# Patient Record
Sex: Female | Born: 1945
Health system: Southern US, Community
[De-identification: ages and names within clinical notes are randomized; demographics above are authoritative.]

## PROBLEM LIST (undated history)

## (undated) DIAGNOSIS — E7800 Pure hypercholesterolemia, unspecified: Secondary | ICD-10-CM

## (undated) DIAGNOSIS — C801 Malignant (primary) neoplasm, unspecified: Secondary | ICD-10-CM

## (undated) DIAGNOSIS — Z8041 Family history of malignant neoplasm of ovary: Secondary | ICD-10-CM

## (undated) DIAGNOSIS — I10 Essential (primary) hypertension: Secondary | ICD-10-CM

## (undated) DIAGNOSIS — K8020 Calculus of gallbladder without cholecystitis without obstruction: Secondary | ICD-10-CM

## (undated) HISTORY — PX: HERNIA REPAIR: SHX51

## (undated) HISTORY — DX: Family history of malignant neoplasm of ovary: Z80.41

---

## 2001-10-23 ENCOUNTER — Other Ambulatory Visit: Admission: RE | Admit: 2001-10-23 | Discharge: 2001-10-23 | Payer: Self-pay | Admitting: Family Medicine

## 2003-08-10 ENCOUNTER — Other Ambulatory Visit: Admission: RE | Admit: 2003-08-10 | Discharge: 2003-08-10 | Payer: Self-pay | Admitting: Family Medicine

## 2004-09-13 ENCOUNTER — Other Ambulatory Visit: Admission: RE | Admit: 2004-09-13 | Discharge: 2004-09-13 | Payer: Self-pay | Admitting: Family Medicine

## 2005-10-16 ENCOUNTER — Other Ambulatory Visit: Admission: RE | Admit: 2005-10-16 | Discharge: 2005-10-16 | Payer: Self-pay | Admitting: Family Medicine

## 2010-11-12 ENCOUNTER — Other Ambulatory Visit: Payer: Self-pay | Admitting: Family Medicine

## 2010-11-12 ENCOUNTER — Other Ambulatory Visit (HOSPITAL_COMMUNITY)
Admission: RE | Admit: 2010-11-12 | Discharge: 2010-11-12 | Disposition: A | Discharge status: Home / Self Care | Payer: PRIVATE HEALTH INSURANCE | Source: Ambulatory Visit | Attending: Family Medicine | Admitting: Family Medicine

## 2010-11-12 DIAGNOSIS — Z124 Encounter for screening for malignant neoplasm of cervix: Secondary | ICD-10-CM | POA: Insufficient documentation

## 2012-09-01 ENCOUNTER — Other Ambulatory Visit: Payer: Self-pay

## 2013-06-03 DIAGNOSIS — E785 Hyperlipidemia, unspecified: Secondary | ICD-10-CM | POA: Diagnosis not present

## 2013-06-03 DIAGNOSIS — I10 Essential (primary) hypertension: Secondary | ICD-10-CM | POA: Diagnosis not present

## 2013-06-03 DIAGNOSIS — L255 Unspecified contact dermatitis due to plants, except food: Secondary | ICD-10-CM | POA: Diagnosis not present

## 2013-06-03 DIAGNOSIS — R7301 Impaired fasting glucose: Secondary | ICD-10-CM | POA: Diagnosis not present

## 2013-06-03 DIAGNOSIS — Z23 Encounter for immunization: Secondary | ICD-10-CM | POA: Diagnosis not present

## 2013-10-07 DIAGNOSIS — Z1231 Encounter for screening mammogram for malignant neoplasm of breast: Secondary | ICD-10-CM | POA: Diagnosis not present

## 2013-11-24 ENCOUNTER — Other Ambulatory Visit: Payer: Self-pay

## 2013-11-24 DIAGNOSIS — L821 Other seborrheic keratosis: Secondary | ICD-10-CM | POA: Diagnosis not present

## 2013-11-24 DIAGNOSIS — C4441 Basal cell carcinoma of skin of scalp and neck: Secondary | ICD-10-CM | POA: Diagnosis not present

## 2013-11-24 DIAGNOSIS — D239 Other benign neoplasm of skin, unspecified: Secondary | ICD-10-CM | POA: Diagnosis not present

## 2013-11-24 DIAGNOSIS — D1801 Hemangioma of skin and subcutaneous tissue: Secondary | ICD-10-CM | POA: Diagnosis not present

## 2013-11-24 DIAGNOSIS — C44319 Basal cell carcinoma of skin of other parts of face: Secondary | ICD-10-CM | POA: Diagnosis not present

## 2013-11-24 DIAGNOSIS — Z85828 Personal history of other malignant neoplasm of skin: Secondary | ICD-10-CM | POA: Diagnosis not present

## 2013-12-01 DIAGNOSIS — E785 Hyperlipidemia, unspecified: Secondary | ICD-10-CM | POA: Diagnosis not present

## 2013-12-01 DIAGNOSIS — E559 Vitamin D deficiency, unspecified: Secondary | ICD-10-CM | POA: Diagnosis not present

## 2013-12-01 DIAGNOSIS — I10 Essential (primary) hypertension: Secondary | ICD-10-CM | POA: Diagnosis not present

## 2013-12-02 ENCOUNTER — Other Ambulatory Visit (HOSPITAL_COMMUNITY)
Admission: RE | Admit: 2013-12-02 | Discharge: 2013-12-02 | Disposition: A | Discharge status: Home / Self Care | Payer: Medicare Other | Source: Ambulatory Visit | Attending: Family Medicine | Admitting: Family Medicine

## 2013-12-02 ENCOUNTER — Other Ambulatory Visit: Payer: Self-pay | Admitting: Family Medicine

## 2013-12-02 DIAGNOSIS — Z124 Encounter for screening for malignant neoplasm of cervix: Secondary | ICD-10-CM | POA: Diagnosis not present

## 2013-12-02 DIAGNOSIS — E785 Hyperlipidemia, unspecified: Secondary | ICD-10-CM | POA: Diagnosis not present

## 2013-12-02 DIAGNOSIS — Z0000 Encounter for general adult medical examination without abnormal findings: Secondary | ICD-10-CM | POA: Diagnosis not present

## 2013-12-02 DIAGNOSIS — Z23 Encounter for immunization: Secondary | ICD-10-CM | POA: Diagnosis not present

## 2013-12-02 DIAGNOSIS — G43909 Migraine, unspecified, not intractable, without status migrainosus: Secondary | ICD-10-CM | POA: Diagnosis not present

## 2013-12-02 DIAGNOSIS — I10 Essential (primary) hypertension: Secondary | ICD-10-CM | POA: Diagnosis not present

## 2013-12-02 DIAGNOSIS — Z1211 Encounter for screening for malignant neoplasm of colon: Secondary | ICD-10-CM | POA: Diagnosis not present

## 2013-12-02 DIAGNOSIS — E559 Vitamin D deficiency, unspecified: Secondary | ICD-10-CM | POA: Diagnosis not present

## 2014-02-01 DIAGNOSIS — Z85828 Personal history of other malignant neoplasm of skin: Secondary | ICD-10-CM | POA: Diagnosis not present

## 2014-02-01 DIAGNOSIS — C44319 Basal cell carcinoma of skin of other parts of face: Secondary | ICD-10-CM | POA: Diagnosis not present

## 2014-04-18 DIAGNOSIS — H2510 Age-related nuclear cataract, unspecified eye: Secondary | ICD-10-CM | POA: Diagnosis not present

## 2014-07-22 DIAGNOSIS — E785 Hyperlipidemia, unspecified: Secondary | ICD-10-CM | POA: Diagnosis not present

## 2014-07-22 DIAGNOSIS — E559 Vitamin D deficiency, unspecified: Secondary | ICD-10-CM | POA: Diagnosis not present

## 2014-07-22 DIAGNOSIS — Z1211 Encounter for screening for malignant neoplasm of colon: Secondary | ICD-10-CM | POA: Diagnosis not present

## 2014-07-22 DIAGNOSIS — Z23 Encounter for immunization: Secondary | ICD-10-CM | POA: Diagnosis not present

## 2014-07-22 DIAGNOSIS — I10 Essential (primary) hypertension: Secondary | ICD-10-CM | POA: Diagnosis not present

## 2014-10-11 ENCOUNTER — Other Ambulatory Visit: Payer: Self-pay

## 2014-10-11 DIAGNOSIS — L814 Other melanin hyperpigmentation: Secondary | ICD-10-CM | POA: Diagnosis not present

## 2014-10-11 DIAGNOSIS — D1801 Hemangioma of skin and subcutaneous tissue: Secondary | ICD-10-CM | POA: Diagnosis not present

## 2014-10-11 DIAGNOSIS — D485 Neoplasm of uncertain behavior of skin: Secondary | ICD-10-CM | POA: Diagnosis not present

## 2014-10-11 DIAGNOSIS — C44311 Basal cell carcinoma of skin of nose: Secondary | ICD-10-CM | POA: Diagnosis not present

## 2014-10-11 DIAGNOSIS — Z85828 Personal history of other malignant neoplasm of skin: Secondary | ICD-10-CM | POA: Diagnosis not present

## 2014-10-11 DIAGNOSIS — L820 Inflamed seborrheic keratosis: Secondary | ICD-10-CM | POA: Diagnosis not present

## 2014-10-11 DIAGNOSIS — D225 Melanocytic nevi of trunk: Secondary | ICD-10-CM | POA: Diagnosis not present

## 2014-10-27 DIAGNOSIS — Z85828 Personal history of other malignant neoplasm of skin: Secondary | ICD-10-CM | POA: Diagnosis not present

## 2014-10-27 DIAGNOSIS — C44311 Basal cell carcinoma of skin of nose: Secondary | ICD-10-CM | POA: Diagnosis not present

## 2015-01-16 DIAGNOSIS — Z1231 Encounter for screening mammogram for malignant neoplasm of breast: Secondary | ICD-10-CM | POA: Diagnosis not present

## 2015-01-25 DIAGNOSIS — E785 Hyperlipidemia, unspecified: Secondary | ICD-10-CM | POA: Diagnosis not present

## 2015-01-25 DIAGNOSIS — Z1211 Encounter for screening for malignant neoplasm of colon: Secondary | ICD-10-CM | POA: Diagnosis not present

## 2015-01-25 DIAGNOSIS — Z0000 Encounter for general adult medical examination without abnormal findings: Secondary | ICD-10-CM | POA: Diagnosis not present

## 2015-01-25 DIAGNOSIS — L237 Allergic contact dermatitis due to plants, except food: Secondary | ICD-10-CM | POA: Diagnosis not present

## 2015-01-25 DIAGNOSIS — E559 Vitamin D deficiency, unspecified: Secondary | ICD-10-CM | POA: Diagnosis not present

## 2015-01-25 DIAGNOSIS — N952 Postmenopausal atrophic vaginitis: Secondary | ICD-10-CM | POA: Diagnosis not present

## 2015-01-25 DIAGNOSIS — I10 Essential (primary) hypertension: Secondary | ICD-10-CM | POA: Diagnosis not present

## 2015-01-25 DIAGNOSIS — R1011 Right upper quadrant pain: Secondary | ICD-10-CM | POA: Diagnosis not present

## 2015-01-25 DIAGNOSIS — G43909 Migraine, unspecified, not intractable, without status migrainosus: Secondary | ICD-10-CM | POA: Diagnosis not present

## 2015-02-15 DIAGNOSIS — I10 Essential (primary) hypertension: Secondary | ICD-10-CM | POA: Diagnosis not present

## 2015-05-22 DIAGNOSIS — H2513 Age-related nuclear cataract, bilateral: Secondary | ICD-10-CM | POA: Diagnosis not present

## 2015-05-23 ENCOUNTER — Encounter: Payer: Self-pay | Admitting: Family Medicine

## 2015-06-21 DIAGNOSIS — Z23 Encounter for immunization: Secondary | ICD-10-CM | POA: Diagnosis not present

## 2015-07-27 DIAGNOSIS — Z1211 Encounter for screening for malignant neoplasm of colon: Secondary | ICD-10-CM | POA: Diagnosis not present

## 2015-07-27 DIAGNOSIS — E785 Hyperlipidemia, unspecified: Secondary | ICD-10-CM | POA: Diagnosis not present

## 2015-07-27 DIAGNOSIS — I10 Essential (primary) hypertension: Secondary | ICD-10-CM | POA: Diagnosis not present

## 2015-08-14 DIAGNOSIS — Z1211 Encounter for screening for malignant neoplasm of colon: Secondary | ICD-10-CM | POA: Diagnosis not present

## 2015-10-13 DIAGNOSIS — D225 Melanocytic nevi of trunk: Secondary | ICD-10-CM | POA: Diagnosis not present

## 2015-10-13 DIAGNOSIS — L918 Other hypertrophic disorders of the skin: Secondary | ICD-10-CM | POA: Diagnosis not present

## 2015-10-13 DIAGNOSIS — D485 Neoplasm of uncertain behavior of skin: Secondary | ICD-10-CM | POA: Diagnosis not present

## 2015-10-13 DIAGNOSIS — Z85828 Personal history of other malignant neoplasm of skin: Secondary | ICD-10-CM | POA: Diagnosis not present

## 2015-10-13 DIAGNOSIS — L821 Other seborrheic keratosis: Secondary | ICD-10-CM | POA: Diagnosis not present

## 2015-10-13 DIAGNOSIS — D1801 Hemangioma of skin and subcutaneous tissue: Secondary | ICD-10-CM | POA: Diagnosis not present

## 2015-10-13 DIAGNOSIS — L814 Other melanin hyperpigmentation: Secondary | ICD-10-CM | POA: Diagnosis not present

## 2015-10-13 DIAGNOSIS — L570 Actinic keratosis: Secondary | ICD-10-CM | POA: Diagnosis not present

## 2015-10-13 DIAGNOSIS — L218 Other seborrheic dermatitis: Secondary | ICD-10-CM | POA: Diagnosis not present

## 2015-11-03 DIAGNOSIS — R109 Unspecified abdominal pain: Secondary | ICD-10-CM | POA: Diagnosis not present

## 2015-12-15 DIAGNOSIS — Z1231 Encounter for screening mammogram for malignant neoplasm of breast: Secondary | ICD-10-CM | POA: Diagnosis not present

## 2016-02-01 DIAGNOSIS — I10 Essential (primary) hypertension: Secondary | ICD-10-CM | POA: Diagnosis not present

## 2016-02-01 DIAGNOSIS — Z0000 Encounter for general adult medical examination without abnormal findings: Secondary | ICD-10-CM | POA: Diagnosis not present

## 2016-02-01 DIAGNOSIS — Z1389 Encounter for screening for other disorder: Secondary | ICD-10-CM | POA: Diagnosis not present

## 2016-02-01 DIAGNOSIS — Z1211 Encounter for screening for malignant neoplasm of colon: Secondary | ICD-10-CM | POA: Diagnosis not present

## 2016-02-01 DIAGNOSIS — E559 Vitamin D deficiency, unspecified: Secondary | ICD-10-CM | POA: Diagnosis not present

## 2016-02-01 DIAGNOSIS — G43909 Migraine, unspecified, not intractable, without status migrainosus: Secondary | ICD-10-CM | POA: Diagnosis not present

## 2016-02-01 DIAGNOSIS — R7301 Impaired fasting glucose: Secondary | ICD-10-CM | POA: Diagnosis not present

## 2016-02-01 DIAGNOSIS — N952 Postmenopausal atrophic vaginitis: Secondary | ICD-10-CM | POA: Diagnosis not present

## 2016-02-01 DIAGNOSIS — L237 Allergic contact dermatitis due to plants, except food: Secondary | ICD-10-CM | POA: Diagnosis not present

## 2016-02-01 DIAGNOSIS — M533 Sacrococcygeal disorders, not elsewhere classified: Secondary | ICD-10-CM | POA: Diagnosis not present

## 2016-02-01 DIAGNOSIS — R109 Unspecified abdominal pain: Secondary | ICD-10-CM | POA: Diagnosis not present

## 2016-02-01 DIAGNOSIS — E785 Hyperlipidemia, unspecified: Secondary | ICD-10-CM | POA: Diagnosis not present

## 2016-02-05 ENCOUNTER — Ambulatory Visit
Admission: RE | Admit: 2016-02-05 | Discharge: 2016-02-05 | Disposition: A | Discharge status: Home / Self Care | Payer: Medicare Other | Source: Ambulatory Visit | Attending: Family Medicine | Admitting: Family Medicine

## 2016-02-05 ENCOUNTER — Other Ambulatory Visit: Payer: Self-pay | Admitting: Family Medicine

## 2016-02-05 DIAGNOSIS — M533 Sacrococcygeal disorders, not elsewhere classified: Secondary | ICD-10-CM

## 2016-03-04 ENCOUNTER — Encounter: Payer: Self-pay | Admitting: Family Medicine

## 2016-06-21 DIAGNOSIS — Z23 Encounter for immunization: Secondary | ICD-10-CM | POA: Diagnosis not present

## 2016-08-12 DIAGNOSIS — R7301 Impaired fasting glucose: Secondary | ICD-10-CM | POA: Diagnosis not present

## 2016-08-12 DIAGNOSIS — I10 Essential (primary) hypertension: Secondary | ICD-10-CM | POA: Diagnosis not present

## 2016-08-12 DIAGNOSIS — E6609 Other obesity due to excess calories: Secondary | ICD-10-CM | POA: Diagnosis not present

## 2016-08-12 DIAGNOSIS — E785 Hyperlipidemia, unspecified: Secondary | ICD-10-CM | POA: Diagnosis not present

## 2016-10-25 DIAGNOSIS — D485 Neoplasm of uncertain behavior of skin: Secondary | ICD-10-CM | POA: Diagnosis not present

## 2016-10-25 DIAGNOSIS — C44612 Basal cell carcinoma of skin of right upper limb, including shoulder: Secondary | ICD-10-CM | POA: Diagnosis not present

## 2016-10-25 DIAGNOSIS — L820 Inflamed seborrheic keratosis: Secondary | ICD-10-CM | POA: Diagnosis not present

## 2016-10-25 DIAGNOSIS — Z85828 Personal history of other malignant neoplasm of skin: Secondary | ICD-10-CM | POA: Diagnosis not present

## 2016-10-25 DIAGNOSIS — L821 Other seborrheic keratosis: Secondary | ICD-10-CM | POA: Diagnosis not present

## 2016-10-25 DIAGNOSIS — D2272 Melanocytic nevi of left lower limb, including hip: Secondary | ICD-10-CM | POA: Diagnosis not present

## 2016-10-25 DIAGNOSIS — L814 Other melanin hyperpigmentation: Secondary | ICD-10-CM | POA: Diagnosis not present

## 2016-10-25 DIAGNOSIS — D1801 Hemangioma of skin and subcutaneous tissue: Secondary | ICD-10-CM | POA: Diagnosis not present

## 2016-11-07 DIAGNOSIS — C44612 Basal cell carcinoma of skin of right upper limb, including shoulder: Secondary | ICD-10-CM | POA: Diagnosis not present

## 2016-11-07 DIAGNOSIS — Z85828 Personal history of other malignant neoplasm of skin: Secondary | ICD-10-CM | POA: Diagnosis not present

## 2016-12-02 DIAGNOSIS — M8589 Other specified disorders of bone density and structure, multiple sites: Secondary | ICD-10-CM | POA: Diagnosis not present

## 2016-12-02 DIAGNOSIS — Z1231 Encounter for screening mammogram for malignant neoplasm of breast: Secondary | ICD-10-CM | POA: Diagnosis not present

## 2017-02-27 DIAGNOSIS — I10 Essential (primary) hypertension: Secondary | ICD-10-CM | POA: Diagnosis not present

## 2017-02-27 DIAGNOSIS — Z1211 Encounter for screening for malignant neoplasm of colon: Secondary | ICD-10-CM | POA: Diagnosis not present

## 2017-02-27 DIAGNOSIS — R7303 Prediabetes: Secondary | ICD-10-CM | POA: Diagnosis not present

## 2017-02-27 DIAGNOSIS — E785 Hyperlipidemia, unspecified: Secondary | ICD-10-CM | POA: Diagnosis not present

## 2017-02-27 DIAGNOSIS — G43909 Migraine, unspecified, not intractable, without status migrainosus: Secondary | ICD-10-CM | POA: Diagnosis not present

## 2017-02-27 DIAGNOSIS — N898 Other specified noninflammatory disorders of vagina: Secondary | ICD-10-CM | POA: Diagnosis not present

## 2017-02-27 DIAGNOSIS — L237 Allergic contact dermatitis due to plants, except food: Secondary | ICD-10-CM | POA: Diagnosis not present

## 2017-02-27 DIAGNOSIS — Z0000 Encounter for general adult medical examination without abnormal findings: Secondary | ICD-10-CM | POA: Diagnosis not present

## 2017-02-27 DIAGNOSIS — E559 Vitamin D deficiency, unspecified: Secondary | ICD-10-CM | POA: Diagnosis not present

## 2017-03-14 DIAGNOSIS — Z1211 Encounter for screening for malignant neoplasm of colon: Secondary | ICD-10-CM | POA: Diagnosis not present

## 2017-06-06 DIAGNOSIS — Z23 Encounter for immunization: Secondary | ICD-10-CM | POA: Diagnosis not present

## 2017-09-01 DIAGNOSIS — I10 Essential (primary) hypertension: Secondary | ICD-10-CM | POA: Diagnosis not present

## 2017-09-01 DIAGNOSIS — E785 Hyperlipidemia, unspecified: Secondary | ICD-10-CM | POA: Diagnosis not present

## 2017-09-01 DIAGNOSIS — R7303 Prediabetes: Secondary | ICD-10-CM | POA: Diagnosis not present

## 2017-09-01 DIAGNOSIS — D179 Benign lipomatous neoplasm, unspecified: Secondary | ICD-10-CM | POA: Diagnosis not present

## 2017-10-31 DIAGNOSIS — C44519 Basal cell carcinoma of skin of other part of trunk: Secondary | ICD-10-CM | POA: Diagnosis not present

## 2017-10-31 DIAGNOSIS — Z85828 Personal history of other malignant neoplasm of skin: Secondary | ICD-10-CM | POA: Diagnosis not present

## 2017-10-31 DIAGNOSIS — D1801 Hemangioma of skin and subcutaneous tissue: Secondary | ICD-10-CM | POA: Diagnosis not present

## 2017-10-31 DIAGNOSIS — L570 Actinic keratosis: Secondary | ICD-10-CM | POA: Diagnosis not present

## 2017-10-31 DIAGNOSIS — D485 Neoplasm of uncertain behavior of skin: Secondary | ICD-10-CM | POA: Diagnosis not present

## 2017-10-31 DIAGNOSIS — L821 Other seborrheic keratosis: Secondary | ICD-10-CM | POA: Diagnosis not present

## 2017-11-14 DIAGNOSIS — H2513 Age-related nuclear cataract, bilateral: Secondary | ICD-10-CM | POA: Diagnosis not present

## 2017-11-21 DIAGNOSIS — Z85828 Personal history of other malignant neoplasm of skin: Secondary | ICD-10-CM | POA: Diagnosis not present

## 2017-11-21 DIAGNOSIS — C44519 Basal cell carcinoma of skin of other part of trunk: Secondary | ICD-10-CM | POA: Diagnosis not present

## 2017-12-18 DIAGNOSIS — M75101 Unspecified rotator cuff tear or rupture of right shoulder, not specified as traumatic: Secondary | ICD-10-CM | POA: Diagnosis not present

## 2018-01-09 DIAGNOSIS — Z1231 Encounter for screening mammogram for malignant neoplasm of breast: Secondary | ICD-10-CM | POA: Diagnosis not present

## 2018-01-20 DIAGNOSIS — N6312 Unspecified lump in the right breast, upper inner quadrant: Secondary | ICD-10-CM | POA: Diagnosis not present

## 2018-01-27 ENCOUNTER — Other Ambulatory Visit: Payer: Self-pay | Admitting: Radiology

## 2018-01-27 DIAGNOSIS — C50211 Malignant neoplasm of upper-inner quadrant of right female breast: Secondary | ICD-10-CM | POA: Diagnosis not present

## 2018-01-28 DIAGNOSIS — C801 Malignant (primary) neoplasm, unspecified: Secondary | ICD-10-CM

## 2018-01-28 HISTORY — DX: Malignant (primary) neoplasm, unspecified: C80.1

## 2018-01-29 ENCOUNTER — Telehealth: Payer: Self-pay | Admitting: Hematology and Oncology

## 2018-01-29 NOTE — Telephone Encounter (Signed)
Spoke with patient to confirm morning [facility] appointment for 5/8, reminder letter sent to patient

## 2018-01-30 ENCOUNTER — Encounter: Payer: Self-pay | Admitting: *Deleted

## 2018-01-30 ENCOUNTER — Telehealth: Payer: Self-pay | Admitting: Hematology and Oncology

## 2018-01-30 DIAGNOSIS — C50211 Malignant neoplasm of upper-inner quadrant of right female breast: Secondary | ICD-10-CM

## 2018-01-30 DIAGNOSIS — Z170 Estrogen receptor positive status [ER+]: Principal | ICD-10-CM

## 2018-01-30 NOTE — Telephone Encounter (Signed)
Spoke with patient to confirm new arrival time for [facility] appointment, patient will be here °

## 2018-02-04 ENCOUNTER — Inpatient Hospital Stay: Payer: Medicare Other | Attending: Hematology and Oncology | Admitting: Hematology and Oncology

## 2018-02-04 ENCOUNTER — Encounter: Payer: Self-pay | Admitting: Hematology and Oncology

## 2018-02-04 ENCOUNTER — Ambulatory Visit
Admission: RE | Admit: 2018-02-04 | Discharge: 2018-02-04 | Disposition: A | Discharge status: Home / Self Care | Payer: Medicare Other | Source: Ambulatory Visit | Attending: Radiation Oncology | Admitting: Radiation Oncology

## 2018-02-04 ENCOUNTER — Ambulatory Visit: Payer: Self-pay | Admitting: General Surgery

## 2018-02-04 ENCOUNTER — Other Ambulatory Visit: Payer: Self-pay

## 2018-02-04 ENCOUNTER — Telehealth: Payer: Self-pay | Admitting: Hematology and Oncology

## 2018-02-04 ENCOUNTER — Inpatient Hospital Stay: Payer: Medicare Other

## 2018-02-04 ENCOUNTER — Ambulatory Visit: Payer: Medicare Other | Attending: General Surgery | Admitting: Physical Therapy

## 2018-02-04 ENCOUNTER — Encounter: Payer: Self-pay | Admitting: Physical Therapy

## 2018-02-04 DIAGNOSIS — C50211 Malignant neoplasm of upper-inner quadrant of right female breast: Secondary | ICD-10-CM | POA: Insufficient documentation

## 2018-02-04 DIAGNOSIS — Z170 Estrogen receptor positive status [ER+]: Secondary | ICD-10-CM

## 2018-02-04 DIAGNOSIS — Z79899 Other long term (current) drug therapy: Secondary | ICD-10-CM | POA: Insufficient documentation

## 2018-02-04 DIAGNOSIS — R293 Abnormal posture: Secondary | ICD-10-CM | POA: Insufficient documentation

## 2018-02-04 DIAGNOSIS — Z79811 Long term (current) use of aromatase inhibitors: Secondary | ICD-10-CM | POA: Insufficient documentation

## 2018-02-04 DIAGNOSIS — G8929 Other chronic pain: Secondary | ICD-10-CM

## 2018-02-04 DIAGNOSIS — Z808 Family history of malignant neoplasm of other organs or systems: Secondary | ICD-10-CM | POA: Diagnosis not present

## 2018-02-04 DIAGNOSIS — D0511 Intraductal carcinoma in situ of right breast: Secondary | ICD-10-CM | POA: Insufficient documentation

## 2018-02-04 DIAGNOSIS — M25511 Pain in right shoulder: Secondary | ICD-10-CM | POA: Insufficient documentation

## 2018-02-04 DIAGNOSIS — Z8041 Family history of malignant neoplasm of ovary: Secondary | ICD-10-CM | POA: Diagnosis not present

## 2018-02-04 DIAGNOSIS — C50911 Malignant neoplasm of unspecified site of right female breast: Secondary | ICD-10-CM

## 2018-02-04 DIAGNOSIS — Z8049 Family history of malignant neoplasm of other genital organs: Secondary | ICD-10-CM | POA: Diagnosis not present

## 2018-02-04 DIAGNOSIS — M25611 Stiffness of right shoulder, not elsewhere classified: Secondary | ICD-10-CM

## 2018-02-04 LAB — CBC WITH DIFFERENTIAL (CANCER CENTER ONLY)
Basophils Absolute: 0 10*3/uL (ref 0.0–0.1)
Basophils Relative: 0 %
Eosinophils Absolute: 0.1 10*3/uL (ref 0.0–0.5)
Eosinophils Relative: 1 %
HCT: 40.4 % (ref 34.8–46.6)
Hemoglobin: 13.2 g/dL (ref 11.6–15.9)
LYMPHS PCT: 20 %
Lymphs Abs: 1.6 10*3/uL (ref 0.9–3.3)
MCH: 28.4 pg (ref 25.1–34.0)
MCHC: 32.8 g/dL (ref 31.5–36.0)
MCV: 86.8 fL (ref 79.5–101.0)
Monocytes Absolute: 0.7 10*3/uL (ref 0.1–0.9)
Monocytes Relative: 9 %
NEUTROS ABS: 5.7 10*3/uL (ref 1.5–6.5)
Neutrophils Relative %: 70 %
PLATELETS: 252 10*3/uL (ref 145–400)
RBC: 4.65 MIL/uL (ref 3.70–5.45)
RDW: 14.7 % — ABNORMAL HIGH (ref 11.2–14.5)
WBC: 8.2 10*3/uL (ref 3.9–10.3)

## 2018-02-04 LAB — CMP (CANCER CENTER ONLY)
ALK PHOS: 101 U/L (ref 40–150)
ALT: 16 U/L (ref 0–55)
ANION GAP: 7 (ref 3–11)
AST: 14 U/L (ref 5–34)
Albumin: 3.9 g/dL (ref 3.5–5.0)
BILIRUBIN TOTAL: 0.5 mg/dL (ref 0.2–1.2)
BUN: 19 mg/dL (ref 7–26)
CALCIUM: 10.7 mg/dL — AB (ref 8.4–10.4)
CO2: 28 mmol/L (ref 22–29)
Chloride: 106 mmol/L (ref 98–109)
Creatinine: 0.77 mg/dL (ref 0.60–1.10)
GFR, Est AFR Am: >60 mL/min (ref >60)
Glucose, Bld: 103 mg/dL (ref 70–140)
POTASSIUM: 3.8 mmol/L (ref 3.5–5.1)
Sodium: 141 mmol/L (ref 136–145)
TOTAL PROTEIN: 7 g/dL (ref 6.4–8.3)

## 2018-02-04 NOTE — Patient Instructions (Signed)

## 2018-02-04 NOTE — Telephone Encounter (Signed)
Gave avs no los

## 2018-02-04 NOTE — Therapy (Signed)
[place] [facility], [place], [address] Phone: [phone]   Fax:  [phone]  Physical Therapy Evaluation  Patient Details  Name: Margaret Hunter MRN: [id number] Date of Birth: 09-22-1946 Referring Provider: Dr. Excell Seltzer   Encounter Date: 02/04/2018  PT End of Session - 02/04/18 1157    Visit Number  1    Number of Visits  2    Date for PT Re-Evaluation  04/01/18    PT Start Time  1020    PT Stop Time  1027 Also saw pt from 1045-1108 for a total of 30 minutes    PT Time Calculation (min)  7 min    Activity Tolerance  Patient tolerated treatment well    Behavior During Therapy  [facility] for tasks assessed/performed       History reviewed. No pertinent past medical history.  Past Surgical History:  Procedure Laterality Date  . CESAREAN SECTION    . HERNIA REPAIR      There were no vitals filed for this visit.   Subjective Assessment - 02/04/18 1147    Subjective  Patient reports she is here today to be seen by her medical team for her newly diagnosed right breast cancer. She also reports right shoulder pain which has been present for several months with insidious onset. She saw Dr. Almedia Balls (non-surgical orthopedist) for this problem and was given a HEP but her pain has not reduced.    Pertinent History  Patient was diagnosed on 01/09/18 with right grade 1-2 invasive ductal carcinoma breast cancer. It measures 5 mm and is located in the upper inner quadrant. It is ER/PR positive and HER2 negative with a Ki67 of 20%.    Patient Stated Goals  Reduce lymphedema risk, learn post op shoulder ROM HEP, decrease right shoulder pain    Currently in Pain?  Yes    Pain Score  -- Varies; none currently    Pain Location  Shoulder    Pain Orientation  Right;Anterior    Pain Descriptors / Indicators  Aching    Pain Type  Chronic pain    Pain Onset  More than a month ago    Pain Frequency  Intermittent     Aggravating Factors   reaching behind, reaching out to the side    Pain Relieving Factors  unknown    Multiple Pain Sites  No         [facility] PT Assessment - 02/04/18 0001      Assessment   Medical Diagnosis  Right breast cancer; right shoulder pain    Referring Provider  Dr. Excell Seltzer    Onset Date/Surgical Date  01/09/18    Hand Dominance  Right    Prior Therapy  none      Precautions   Precautions  Other (comment)    Precaution Comments  active cancer      Restrictions   Weight Bearing Restrictions  No      Balance Screen   Has the patient fallen in the past 6 months  No    Has the patient had a decrease in activity level because of a fear of falling?   No    Is the patient reluctant to leave their home because of a fear of falling?   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    Leisure  She does yardwork and walks 30-40 min 3-4x/week      Cognition   Overall Cognitive Status  Within Functional Limits for tasks assessed      Posture/Postural Control   Posture/Postural Control  Postural limitations    Postural Limitations  Rounded Shoulders;Forward head      ROM / Strength   AROM / PROM / Strength  AROM;Strength      AROM   AROM Assessment Site  Shoulder;Cervical    Right/Left Shoulder  Right;Left    Right Shoulder Extension  36 Degrees    Right Shoulder Flexion  125 Degrees    Right Shoulder ABduction  134 Degrees painful    Right Shoulder Internal Rotation  35 Degrees painful    Right Shoulder External Rotation  72 Degrees    Left Shoulder Extension  40 Degrees    Left Shoulder Flexion  142 Degrees    Left Shoulder ABduction  147 Degrees    Left Shoulder Internal Rotation   51 Degrees    Left Shoulder External Rotation  53 Degrees    Cervical Flexion  WNL    Cervical Extension  75% limited    Cervical - Right Side Bend  50% limited    Cervical - Left Side Bend  50% limited    Cervical - Right Rotation  50% limited    Cervical - Left Rotation  50% limited      Strength   Overall Strength Comments  Right shoulder not fullt tested due to pain but grossly 4/5      Palpation   Palpation comment  tender to palpation right anterior shoulder and lateral shoulder at deltoid insertion.      Special Tests    Special Tests  Rotator Cuff Impingement;Biceps/Labral Tests    Rotator Cuff Impingment tests  Empty Can test;Speed's test      Empty Can test   Findings  Negative    Side  Right      Speed's test   Findings  Positive    Side  Right        LYMPHEDEMA/ONCOLOGY QUESTIONNAIRE - 02/04/18 1156      Type   Cancer Type  Right breast cancer      Lymphedema Assessments   Lymphedema Assessments  Upper extremities      Right Upper Extremity Lymphedema   10 cm Proximal to Olecranon Process  34.5 cm    Olecranon Process  25 cm    10 cm Proximal to Ulnar Styloid Process  22.7 cm    Just Proximal to Ulnar Styloid Process  15.1 cm    Across Hand at [employer]  18.8 cm    At [place] of 2nd Digit  6.4 cm      Left Upper Extremity Lymphedema   10 cm Proximal to Olecranon Process  33.5 cm    Olecranon Process  25.1 cm    10 cm Proximal to Ulnar Styloid Process  22.5 cm    Just Proximal to Ulnar Styloid Process  15.2 cm    Across Hand at [employer]  18.7 cm    At [place] of 2nd Digit  6.4 cm             Objective measurements completed on examination: See above findings.  Patient was instructed today in a home exercise program today for post op shoulder range of motion. These included active assist shoulder flexion in sitting, scapular retraction, wall walking with shoulder abduction, and hands behind head external rotation.  She was  encouraged to do these twice a day, holding 3 seconds and repeating 5 times when permitted by her physician.      PT Education - 02/04/18 1157    Education provided  Yes    Education Details  Lymphedema risk reduction and post op shoulder ROM HEP    Person(s) Educated  Patient    Methods  Explanation;Demonstration;Handout    Comprehension  Returned demonstration;Verbalized understanding          PT Long Term Goals - 02/04/18 1204      PT LONG TERM GOAL #1   Title  Patient will demonstrate she has returned to baseline related to shoulder function and ROM post operatively.    Time  8    Period  Weeks    Status  New      PT LONG TERM GOAL #2   Title  Patient will increase right shoulder active internal rotation to >/= 50 degrees for increased function.    Baseline  31 degrees    Time  4    Period  Weeks    Status  New      PT LONG TERM GOAL #3   Title  Patient will increase right shoulder flexion to >/= 140 degrees for increased ease reaching overhead.    Baseline  125 degrees    Time  4    Period  Weeks    Status  New      PT LONG TERM GOAL #4   Title  Patient will report >/= 50% reduction in right shoulder pain.    Time  4    Period  Weeks    Status  New      Breast Clinic Goals - 02/04/18 1204      Patient will be able to verbalize understanding of pertinent lymphedema risk reduction practices relevant to her diagnosis specifically related to skin care.   Time  1    Period  Days    Status  Achieved      Patient will be able to return demonstrate and/or verbalize understanding of the post-op home exercise program related to regaining shoulder range of motion.   Time  1    Period  Days    Status  Achieved      Patient will be able to verbalize understanding of the importance of attending the postoperative After Breast Cancer Class for further lymphedema risk reduction education and therapeutic exercise.   Time  1    Period  Days    Status  Achieved             Plan - 02/04/18 1158    Clinical Impression Statement  Patient was diagnosed on 01/09/18 with right grade 1-2 invasive ductal carcinoma breast cancer. It measures 5 mm and is located in the upper inner quadrant. It is ER/PR positive and HER2 negative with a Ki67 of 20%. Her multidisciplinary medical team met prior to her assessments to determine a recommended treatment plan. She is planning to have a right lumpectomy and sentinel node biopsy followed by radiation and anti-estrogen therapy. She will benefit from a PT reassessment post operatively to determine needs. She will benefit from PT now to address her pre-existing right shoulder pain and to reduce the  risk of a more significant shoulder problem after surgery. This will also allow for potential resolution of her shoulder problem prior to being at risk for lymphedema which will limit the treatment available to her to address the shoulder.    History and Personal Factors relevant to plan of care:  None    Clinical Presentation  Stable    Clinical Decision Making  Low    Rehab Potential  Excellent    Clinical Impairments Affecting Rehab Potential  None    PT Frequency  2x / week Followed by a post op reassessment which will be in ~ 8 weeks    PT Duration  4 weeks    PT Treatment/Interventions  ADLs/Self Care Home Management;Moist Heat;Iontophoresis '4mg'$ /ml Dexamethasone;Therapeutic activities;Therapeutic exercise;Patient/family education;Manual techniques;Passive range of motion;Taping;Dry needling    PT Next Visit Plan  Pulleys, ROM exercises (supine cane?), PROM, soft tissue work, ionto if MD approves, ice massage?    PT Home Exercise Plan  Post op shoulder ROM HEP    Consulted and Agree with Plan of Care  Patient       Patient will benefit from skilled therapeutic intervention in order to improve the following deficits and impairments:  Decreased knowledge of precautions, Impaired UE functional use, Decreased strength, Decreased  range of motion, Postural dysfunction, Pain  Visit Diagnosis: Malignant neoplasm of upper-inner quadrant of right breast in female, estrogen receptor positive ([place]) - Plan: PT plan of care cert/re-cert  Abnormal posture - Plan: PT plan of care cert/re-cert  Chronic right shoulder pain - Plan: PT plan of care cert/re-cert  Stiffness of right shoulder, not elsewhere classified - Plan: PT plan of care cert/re-cert   Patient will follow up at outpatient cancer rehab 3-4 weeks following surgery.  If the patient requires physical therapy at that time, a specific plan will be dictated and sent to the referring physician for approval. The patient was educated today on appropriate basic range of motion exercises to begin post operatively and the importance of attending the After Breast Cancer class following surgery.  Patient was educated today on lymphedema risk reduction practices as it pertains to recommendations that will benefit the patient immediately following surgery.  She verbalized good understanding.      Problem List Patient Active Problem List   Diagnosis Date Noted  . Malignant neoplasm of upper-inner quadrant of right breast in female, estrogen receptor positive ([place]) 01/30/2018    Annia Friendly, PT 02/04/18 12:09 PM  [facility], [place], [address] Phone: [phone]   Fax:  [phone]  Name: LAYIA WALLA MRN: [id number] Date of Birth: [birth year]

## 2018-02-04 NOTE — Progress Notes (Signed)
[facility] NOTE  Patient Care Team: Harlan Stains, MD as PCP - General (Family Medicine) Excell Seltzer, MD as Consulting Physician (General Surgery) Nicholas Lose, MD as Consulting Physician (Hematology and Oncology) Gery Pray, MD as Consulting Physician (Radiation Oncology)  CHIEF COMPLAINTS/PURPOSE OF CONSULTATION:  Newly diagnosed breast cancer  HISTORY OF PRESENTING ILLNESS:  Margaret Hunter 72 y.o. female is here because of recent diagnosis of right breast cancer.  Patient had a routine screening mammogram that detected a right breast mass it was 0.5 cm in size.  Biopsy of this lesion came back as invasive ductal carcinoma that was grade 1-2 that was ER PR positive HER-2 negative and a Ki-67 of 20%.  She was presented this morning to the multidisciplinary tumor board and she is here today to discuss her treatment plan.  I reviewed her records extensively and collaborated the history with the patient.  SUMMARY OF ONCOLOGIC HISTORY:   Malignant neoplasm of upper-inner quadrant of right breast in female, estrogen receptor positive ([place])   01/30/2018 Initial Diagnosis    Screening detected right breast mass 0.5 cm at 1 o'clock position 9 cm from nipple, axilla negative biopsy: Invasive ductal carcinoma and a papillary lesion grade 1-2, ER PR positive HER-2 negative Ki-67 20%, T1 a N0 stage I a clinical stage AJCC 8       MEDICAL HISTORY:  History reviewed. No pertinent past medical history.  SURGICAL HISTORY: Past Surgical History:  Procedure Laterality Date  . CESAREAN SECTION    . HERNIA REPAIR      SOCIAL HISTORY: Social History   Socioeconomic History  . Marital status: Married    Spouse name: Not on file  . Number of children: Not on file  . Years of education: Not on file  . Highest education level: Not on file  Occupational History  . Not on file  Social Needs  . Financial resource strain: Not on file  . Food insecurity:    Worry:  Not on file    Inability: Not on file  . Transportation needs:    Medical: Not on file    Non-medical: Not on file  Tobacco Use  . Smoking status: Never Smoker  . Smokeless tobacco: Never Used  Substance and Sexual Activity  . Alcohol use: Yes  . Drug use: Never  . Sexual activity: Not on file  Lifestyle  . Physical activity:    Days per week: Not on file    Minutes per session: Not on file  . Stress: Not on file  Relationships  . Social connections:    Talks on phone: Not on file    Gets together: Not on file    Attends religious service: Not on file    Active member of club or organization: Not on file    Attends meetings of clubs or organizations: Not on file    Relationship status: Not on file  . Intimate partner violence:    Fear of current or ex partner: Not on file    Emotionally abused: Not on file    Physically abused: Not on file    Forced sexual activity: Not on file  Other Topics Concern  . Not on file  Social History Narrative  . Not on file    FAMILY HISTORY: Sister was diagnosed with breast cancer age 13  ALLERGIES:  is allergic to penicillin g.  MEDICATIONS:  Current Outpatient Medications  Medication Sig Dispense Refill  . aspirin EC 81 MG  tablet Take 81 mg by mouth daily.    . Calcium Acetate, Phos Binder, (CALCIUM ACETATE PO) Take by mouth.    . cholecalciferol (VITAMIN D) 1000 units tablet Take 2,000 Units by mouth daily.    Marland Kitchen losartan-hydrochlorothiazide (HYZAAR) 100-25 MG tablet Take by mouth.    . Multiple Vitamin (MULTIVITAMIN) tablet Take 1 tablet by mouth daily.    . simvastatin (ZOCOR) 40 MG tablet   2   No current facility-administered medications for this visit.     REVIEW OF SYSTEMS:   Constitutional: Denies fevers, chills or abnormal night sweats Eyes: Denies blurriness of vision, double vision or watery eyes Ears, nose, mouth, throat, and face: Denies mucositis or sore throat Respiratory: Denies cough, dyspnea or  wheezes Cardiovascular: Denies palpitation, chest discomfort or lower extremity swelling Gastrointestinal:  Denies nausea, heartburn or change in bowel habits Skin: Denies abnormal skin rashes Lymphatics: Denies new lymphadenopathy or easy bruising Neurological:Denies numbness, tingling or new weaknesses Behavioral/Psych: Mood is stable, no new changes  Breast:  Denies any palpable lumps or discharge All other systems were reviewed with the patient and are negative.  PHYSICAL EXAMINATION: ECOG PERFORMANCE STATUS: 1 - Symptomatic but completely ambulatory  Vitals:   02/04/18 0854  BP: (!) 148/71  Pulse: 71  Resp: 18  Temp: 98.6 F (37 C)  SpO2: 97%   Filed Weights   02/04/18 0854  Weight: 201 lb 4.8 oz (91.3 kg)    GENERAL:alert, no distress and comfortable SKIN: skin color, texture, turgor are normal, no rashes or significant lesions EYES: normal, conjunctiva are pink and non-injected, sclera clear OROPHARYNX:no exudate, no erythema and lips, buccal mucosa, and tongue normal  NECK: supple, thyroid normal size, non-tender, without nodularity LYMPH:  no palpable lymphadenopathy in the cervical, axillary or inguinal LUNGS: clear to auscultation and percussion with normal breathing effort HEART: regular rate & rhythm and no murmurs and no lower extremity edema ABDOMEN:abdomen soft, non-tender and normal bowel sounds Musculoskeletal:no cyanosis of digits and no clubbing  PSYCH: alert & oriented x 3 with fluent speech NEURO: no focal motor/sensory deficits BREAST: No palpable nodules in breast. No palpable axillary or supraclavicular lymphadenopathy (exam performed in the presence of a chaperone)   RADIOGRAPHIC STUDIES: I have personally reviewed the radiological reports and agreed with the findings in the report.  ASSESSMENT AND PLAN:  Malignant neoplasm of upper-inner quadrant of right breast in female, estrogen receptor positive ([place]) Screening detected right breast mass  0.5 cm at 1 o'clock position 9 cm from nipple, axilla negative biopsy: Invasive ductal carcinoma and a papillary lesion grade 1-2, ER PR positive HER-2 negative Ki-67 20%, T1 a N0 stage I a clinical stage AJCC 8  Pathology and radiology counseling: Discussed with the patient, the details of pathology including the type of breast cancer,the clinical staging, the significance of ER, PR and HER-2/neu receptors and the implications for treatment. After reviewing the pathology in detail, we proceeded to discuss the different treatment options between surgery, radiation, chemotherapy, antiestrogen therapies.  Recommendation: 1. Breast conserving surgery with sentinel lymph node biopsy 2. adjuvant radiation  3.Adjuvant antiestrogen therapy  4. Genetics consultation because a sister had breast cancer age 61  Return to clinic after surgery to discuss the final pathology report.   All questions were answered. The patient knows to call the clinic with any problems, questions or concerns.    Harriette Ohara, MD 02/04/18

## 2018-02-04 NOTE — Assessment & Plan Note (Signed)
Screening detected right breast mass 0.5 cm at 1 o'clock position 9 cm from nipple, axilla negative biopsy: Invasive ductal carcinoma and a papillary lesion grade 1-2, ER PR positive HER-2 negative Ki-67 20%, T1 a N0 stage I a clinical stage AJCC 8  Pathology and radiology counseling: Discussed with the patient, the details of pathology including the type of breast cancer,the clinical staging, the significance of ER, PR and HER-2/neu receptors and the implications for treatment. After reviewing the pathology in detail, we proceeded to discuss the different treatment options between surgery, radiation, chemotherapy, antiestrogen therapies.  Recommendation: 1. Breast conserving surgery with sentinel lymph node biopsy 2. adjuvant radiation  3.Adjuvant antiestrogen therapy  4. Genetics consultation because a sister had breast cancer age 60  Return to clinic after surgery to discuss the final pathology report.

## 2018-02-04 NOTE — Progress Notes (Signed)
Radiation Oncology         [phone] ________________________________  Multidisciplinary Breast Oncology Clinic Initial Outpatient Consultation  Name: Margaret Hunter MRN: [id number]  Date: 02/04/2018  DOB: 06/16/1946  CC:Harlan Stains, MD  Excell Seltzer, MD   REFERRING PHYSICIAN: Excell Seltzer, MD  DIAGNOSIS: Stage IA, cT1a, cN0 Right Breast UIQ Invasive Ductal Carcinoma, ER(+) / PR(+) / Her2(-) / Ki-67 20%, Grade 1-2    ICD-10-CM   1. Malignant neoplasm of upper-inner quadrant of right breast in female, estrogen receptor positive ([place]) C50.211    Z17.0     HISTORY OF PRESENT ILLNESS::Margaret Hunter is a 72 y.o. female who is here for evaluation of her newly diagnosed right breast cancer. She initially presented with screening detected right breast mass on 01/09/18 mammography. Ultrasound from 01/20/18 showed the irregular mass in the right breast measuring 0.5 cm at the 1:00 position, 9 cm from nipple. Right axilla was negative on ultrasound. Biopsy on 01/30/18 revealed grade 1-2 invasive ductal carcinoma, ER/PR positive, Her2 negative, Ki-67 20%.   The patient was referred today for presentation in the multidisciplinary conference.  Radiology studies and pathology slides were presented there for review and discussion of treatment options.  A consensus was discussed regarding potential next steps.   She has a family history of ovarian cancer in her sister, diagnosed at age 67.  GYN/OB History: She had her first menstrual period at age 74. Approximate date of her last period is in 1994. She has never used hormone replacement. She has carried 3 children to term, with the first birth at age 58. She used birth control pills as contraception for 8-10 years.    PREVIOUS RADIATION THERAPY: No  PAST MEDICAL HISTORY:  has no past medical history on file.    PAST SURGICAL HISTORY: Past Surgical History:  Procedure Laterality Date  . CESAREAN SECTION    . HERNIA REPAIR       FAMILY HISTORY: family history includes Leukemia in her brother; Uterine cancer in her sister.  SOCIAL HISTORY:  reports that she has never smoked. She has never used smokeless tobacco. She reports that she drinks alcohol. She reports that she does not use drugs.  ALLERGIES: Penicillin g  MEDICATIONS:  Current Outpatient Medications  Medication Sig Dispense Refill  . aspirin EC 81 MG tablet Take 81 mg by mouth daily.    . Calcium Acetate, Phos Binder, (CALCIUM ACETATE PO) Take by mouth.    . cholecalciferol (VITAMIN D) 1000 units tablet Take 2,000 Units by mouth daily.    Marland Kitchen losartan-hydrochlorothiazide (HYZAAR) 100-25 MG tablet Take by mouth.    . Multiple Vitamin (MULTIVITAMIN) tablet Take 1 tablet by mouth daily.    . simvastatin (ZOCOR) 40 MG tablet   2   No current facility-administered medications for this encounter.     REVIEW OF SYSTEMS:  REVIEW OF SYSTEMS: A 10+ POINT REVIEW OF SYSTEMS WAS OBTAINED including neurology, dermatology, psychiatry, cardiac, respiratory, lymph, extremities, GI, GU, musculoskeletal, constitutional, reproductive, HEENT. All pertinent positives are noted in the HPI. All others are negative.   PHYSICAL EXAM:  Vitals with BMI 02/04/2018  Height _0   Weight 201 lbs 5 oz  BMI 75.64  Systolic 332  Diastolic 71  Pulse 71  Respirations 18   General: Alert and oriented, in no acute distress. HEENT: Head is normocephalic. Extraocular movements are intact. Oropharynx is clear. Neck: Neck is supple, no palpable cervical or supraclavicular lymphadenopathy. Heart: Regular in rate and rhythm with no  murmurs, rubs, or gallops. Chest: Clear to auscultation bilaterally, with no rhonchi, wheezes, or rales. Abdomen: Soft, nontender, nondistended, with no rigidity or guarding. Extremities: No cyanosis or edema. Lymphatics: see Neck Exam Skin: No concerning lesions. Musculoskeletal: Symmetric strength and muscle tone throughout. Neurologic: Cranial nerves II  through XII are grossly intact. No obvious focalities. Speech is fluent. Coordination is intact. Psychiatric: Judgment and insight are intact. Affect is appropriate.  Breast Exam Left Breast: No palpable mass or nipple discharge. Right Breast: Small biopsy site in the UIQ with mild bruising. No palpable mass, nipple discharge or bleeding.  KPS = 100  LABORATORY DATA:  Lab Results  Component Value Date   WBC 8.2 02/04/2018   HGB 13.2 02/04/2018   HCT 40.4 02/04/2018   MCV 86.8 02/04/2018   PLT 252 02/04/2018   Lab Results  Component Value Date   NA 141 02/04/2018   K 3.8 02/04/2018   CL 106 02/04/2018   CO2 28 02/04/2018   Lab Results  Component Value Date   ALT 16 02/04/2018   AST 14 02/04/2018   ALKPHOS 101 02/04/2018   BILITOT 0.5 02/04/2018    PULMONARY FUNCTION TEST:   Recent Review Flowsheet Data    There is no flowsheet data to display.      RADIOGRAPHY: [facility] images reviewed in conference and reports reviewed    IMPRESSION: Stage IA, cT1a, cN0 Right Breast UIQ Invasive Ductal Carcinoma, ER(+) / PR(+) / Her2(-) / Ki-67 20%, Grade 1-2. Patient would be a good candidate for breast conservation with lumpectomy and sentinel lymph node procedure. We discussed the general indications for radiation therapy, and the patient will be seen after surgery to determine if she would like to proceed with radiation therapy in combination with hormonal therapy or hormonal therapy alone.   PLAN:  1. Genetics evaluation (sister with ovarian cancer) 2. Lumpectomy and sentinel lymph node procedure 3. Adjuvant hormonal therapy 4. Radiation to be determined after definitive surgery     ------------------------------------------------  Blair Promise, PhD, MD  This document serves as a record of services personally performed by Gery Pray, MD. It was created on his behalf by Rae Lips, a trained medical scribe. The creation of this record is based on the scribe's personal  observations and the provider's statements to them. This document has been checked and approved by the attending provider.

## 2018-02-04 NOTE — Progress Notes (Signed)
Nutrition Assessment  Reason for Assessment:  Pt seen in Breast Clinic  ASSESSMENT:   72 year old female with new diagnosis of breast cancer.  Past medical history reviewed.    Patient reports normal appetite.    Medications:  reviewed  Labs: reviewed  Anthropometrics:   Height: 62 inches Weight: 201 lb BMI: 36   NUTRITION DIAGNOSIS: Food and nutrition related knowledge deficit related to new diagnosis of breast cancer as evidenced by no prior need for nutrition related information.  INTERVENTION:   Discussed and provided packet of information regarding nutritional tips for breast cancer patients.  Questions answered.  Teachback method used.  Contact information provided and patient knows to contact me with questions/concerns.    MONITORING, EVALUATION, and GOAL: Pt will consume a healthy plant based diet to maintain lean body mass throughout treatment.   Marrian Bells B. Zenia Resides, [place], [place] Registered Dietitian [phone] (pager)

## 2018-02-05 ENCOUNTER — Encounter: Payer: Medicare Other | Admitting: Genetics

## 2018-02-05 ENCOUNTER — Inpatient Hospital Stay: Payer: Medicare Other | Admitting: Genetics

## 2018-02-06 ENCOUNTER — Inpatient Hospital Stay (HOSPITAL_BASED_OUTPATIENT_CLINIC_OR_DEPARTMENT_OTHER): Payer: Medicare Other | Admitting: Genetics

## 2018-02-06 ENCOUNTER — Ambulatory Visit: Payer: Medicare Other | Admitting: Physical Therapy

## 2018-02-06 ENCOUNTER — Encounter: Payer: Self-pay | Admitting: Genetics

## 2018-02-06 ENCOUNTER — Encounter: Payer: Self-pay | Admitting: Physical Therapy

## 2018-02-06 DIAGNOSIS — M25511 Pain in right shoulder: Secondary | ICD-10-CM

## 2018-02-06 DIAGNOSIS — C50211 Malignant neoplasm of upper-inner quadrant of right female breast: Secondary | ICD-10-CM

## 2018-02-06 DIAGNOSIS — M25611 Stiffness of right shoulder, not elsewhere classified: Secondary | ICD-10-CM

## 2018-02-06 DIAGNOSIS — R293 Abnormal posture: Secondary | ICD-10-CM | POA: Diagnosis not present

## 2018-02-06 DIAGNOSIS — Z170 Estrogen receptor positive status [ER+]: Secondary | ICD-10-CM | POA: Diagnosis not present

## 2018-02-06 DIAGNOSIS — G8929 Other chronic pain: Secondary | ICD-10-CM | POA: Diagnosis not present

## 2018-02-06 DIAGNOSIS — Z8041 Family history of malignant neoplasm of ovary: Secondary | ICD-10-CM | POA: Diagnosis not present

## 2018-02-06 NOTE — Patient Instructions (Signed)

## 2018-02-06 NOTE — Therapy (Signed)
[place], [place], [address] Phone: [phone]   Fax:  [phone]  Physical Therapy Treatment  Patient Details  Name: Margaret Hunter MRN: [id number] Date of Birth: 1945-12-11 Referring Provider: Dr. Excell Seltzer   Encounter Date: 02/06/2018  PT End of Session - 02/06/18 1159    Visit Number  2    Number of Visits  8    Date for PT Re-Evaluation  04/01/18    PT Start Time  1110    PT Stop Time  1153    PT Time Calculation (min)  43 min    Activity Tolerance  Patient tolerated treatment well    Behavior During Therapy  [facility] for tasks assessed/performed       Past Medical History:  Diagnosis Date  . Family history of ovarian cancer     Past Surgical History:  Procedure Laterality Date  . CESAREAN SECTION    . HERNIA REPAIR      There were no vitals filed for this visit.  Subjective Assessment - 02/06/18 1110    Subjective  I have had right shoulder pain for 4 or 5 months. Every now and then it just aches.     Pertinent History  Patient was diagnosed on 01/09/18 with right grade 1-2 invasive ductal carcinoma breast cancer. It measures 5 mm and is located in the upper inner quadrant. It is ER/PR positive and HER2 negative with a Ki67 of 20%.    Patient Stated Goals  Reduce lymphedema risk, learn post op shoulder ROM HEP, decrease right shoulder pain    Currently in Pain?  No/denies    Pain Score  0-No pain                       [facility] Adult PT Treatment/Exercise - 02/06/18 0001      Exercises   Exercises  Other Exercises attempted supine scap but stopped secondary to discomfort      Shoulder Exercises: Pulleys   Flexion  2 minutes pt returned therapist demo    ABduction  2 minutes pt returned therapist demo      Shoulder Exercises: Therapy Ball   Flexion  Both;10 reps with stretch at top, pt returned therapist demo      Iontophoresis   Type of Iontophoresis   Dexamethasone    Location  right anterior shoulder in area of P!    Dose  '4mg'$ /mL    Time  4-6 hours- issued brochure and instructed pt to remove sooner if there is any increased irritation      Manual Therapy   Manual Therapy  Myofascial release    Myofascial Release  cross friction massage to tendons in anterior shoulder where pt has been having pain and discomfort                  PT Long Term Goals - 02/04/18 1204      PT LONG TERM GOAL #1   Title  Patient will demonstrate she has returned to baseline related to shoulder function and ROM post operatively.    Time  8    Period  Weeks    Status  New      PT LONG TERM GOAL #2   Title  Patient will increase right shoulder active internal rotation to >/= 50 degrees for increased function.    Baseline  31 degrees    Time  4    Period  Weeks  Status  New      PT LONG TERM GOAL #3   Title  Patient will increase right shoulder flexion to >/= 140 degrees for increased ease reaching overhead.    Baseline  125 degrees    Time  4    Period  Weeks    Status  New      PT LONG TERM GOAL #4   Title  Patient will report >/= 50% reduction in right shoulder pain.    Time  4    Period  Weeks    Status  New      Breast Clinic Goals - 02/04/18 1204      Patient will be able to verbalize understanding of pertinent lymphedema risk reduction practices relevant to her diagnosis specifically related to skin care.   Time  1    Period  Days    Status  Achieved      Patient will be able to return demonstrate and/or verbalize understanding of the post-op home exercise program related to regaining shoulder range of motion.   Time  1    Period  Days    Status  Achieved      Patient will be able to verbalize understanding of the importance of attending the postoperative After Breast Cancer Class for further lymphedema risk reduction education and therapeutic exercise.   Time  1    Period  Days    Status  Achieved            Plan - 02/06/18 1159    Clinical Impression Statement  Began gentle AAROM exercises that were pain free. Pt has been compensating a lot at home to avoid irritation of her right shoulder. Tried supine scap but stopped due to increased irritation at anterior shoulder. Performed cross friction massage to tendons in area of pain prior to applying iontophoresis to help decrease pain. Educated pt to avoid any activities that cause pain over the weekend and use ice to help with inflammation and discomfort.     Rehab Potential  Excellent    Clinical Impairments Affecting Rehab Potential  None    PT Frequency  2x / week    PT Duration  4 weeks    PT Treatment/Interventions  ADLs/Self Care Home Management;Moist Heat;Iontophoresis '4mg'$ /ml Dexamethasone;Therapeutic activities;Therapeutic exercise;Patient/family education;Manual techniques;Passive range of motion;Taping;Dry needling    PT Next Visit Plan  Pulleys, ROM exercises (supine cane?), PROM, soft tissue work, see if ionto helped and continue, ice massage- begin supine scap if no discomfort    PT Home Exercise Plan  Post op shoulder ROM HEP    Consulted and Agree with Plan of Care  Patient       Patient will benefit from skilled therapeutic intervention in order to improve the following deficits and impairments:  Decreased knowledge of precautions, Impaired UE functional use, Decreased strength, Decreased range of motion, Postural dysfunction, Pain  Visit Diagnosis: Abnormal posture  Chronic right shoulder pain  Stiffness of right shoulder, not elsewhere classified     Problem List Patient Active Problem List   Diagnosis Date Noted  . Family history of ovarian cancer   . Malignant neoplasm of upper-inner quadrant of right breast in female, estrogen receptor positive ([place]) 01/30/2018    Allyson Sabal [facility] 02/06/2018, 12:05 PM  [place],  [place], [address] Phone: [phone]   Fax:  [phone]  Name: Margaret Hunter MRN: [id number] Date of Birth: [birth year]  Manus Gunning,  PT 02/06/18 12:05 PM

## 2018-02-06 NOTE — Progress Notes (Signed)
REFERRING PROVIDER: Nicholas Lose, MD [place], [place] [address]  PRIMARY PROVIDER:  Harlan Stains, MD  PRIMARY REASON FOR VISIT:  1. Malignant neoplasm of upper-inner quadrant of right breast in female, estrogen receptor positive ([place])   2. Family history of ovarian cancer     HISTORY OF PRESENT ILLNESS:   Margaret Hunter, a 72 y.o. female, was seen for a [place] cancer genetics consultation at the request of Margaret Hunter due to a personal and family history of cancer.  Margaret Hunter presents to clinic today to discuss the possibility of a hereditary predisposition to cancer, genetic testing, and to further clarify her future cancer risks, as well as potential cancer risks for family members.   On 01/30/2018, at the age of 34, Margaret Hunter was diagnosed with Invasive ductal carcinoma of the right breast. She is planning on having breast conserving surgery followed by adjuvant antiestrogen therapy.   CANCER HISTORY:    Malignant neoplasm of upper-inner quadrant of right breast in female, estrogen receptor positive ([place])   01/30/2018 Initial Diagnosis    Screening detected right breast mass 0.5 cm at 1 o'clock position 9 cm from nipple, axilla negative biopsy: Invasive ductal carcinoma and a papillary lesion grade 1-2, ER PR positive HER-2 negative Ki-67 20%, T1 a N0 stage I a clinical stage AJCC 8        HORMONAL RISK FACTORS:  Menarche was at age 41.  First live birth at age 88.  OCP use for approximately 8-10 years.  Ovaries intact: yes.  Menopausal status: postmenopausal.  HRT use: 0 years. Colonoscopy: yes; 2005, no polyps. Mammogram within the last year: yes.  Past Medical History:  Diagnosis Date  . Family history of ovarian cancer     Past Surgical History:  Procedure Laterality Date  . CESAREAN SECTION    . HERNIA REPAIR      Social History   Socioeconomic History  . Marital status: Married    Spouse name: Not on file  . Number of children:  Not on file  . Years of education: Not on file  . Highest education level: Not on file  Occupational History  . Not on file  Social Needs  . Financial resource strain: Not on file  . Food insecurity:    Worry: Not on file    Inability: Not on file  . Transportation needs:    Medical: Not on file    Non-medical: Not on file  Tobacco Use  . Smoking status: Never Smoker  . Smokeless tobacco: Never Used  Substance and Sexual Activity  . Alcohol use: Yes  . Drug use: Never  . Sexual activity: Not on file  Lifestyle  . Physical activity:    Days per week: Not on file    Minutes per session: Not on file  . Stress: Not on file  Relationships  . Social connections:    Talks on phone: Not on file    Gets together: Not on file    Attends religious service: Not on file    Active member of club or organization: Not on file    Attends meetings of clubs or organizations: Not on file    Relationship status: Not on file  Other Topics Concern  . Not on file  Social History Narrative  . Not on file     FAMILY HISTORY:  We obtained a detailed, 4-generation family history.  Significant diagnoses are listed below: Family History  Problem Relation Age of  Onset  . Uterine cancer Mother        found some cancer cells in uterus - was told it was 'hormone induced'  . Ovarian cancer Sister 95       caught early (found incidentially during surgery), had chemo  . Leukemia Brother 5  . Heart disease Maternal Grandmother 71   Margaret Hunter has a daughter and 2 sons ages 69-50.  Margaret Hunter also has 8 grandchildren. Margaret Hunter had 1 brother who died at 54 years old due to leukemia dx at 73 years of age.  Margaret Hunter sister was diagnosed with ovarian cancer at 7.  This was found incidentally, and therefore was found early.  She had chemotherapy and is still living at 82.  She has no chidren.   Margaret Hunter father: died at 64 due to emphysema.  He had a brain tumor that was not cancer.  Paternal  aunts/Uncles: 7 paternal aunts, 6 paternal uncles, no known history of cancer. Dimentia runs in this side of the family.  Paternal cousins: unknown Paternal grandfather: lived into his 32's with no known history of cancer.  Paternal grandmother:lived into her 40's with no known history of cancer.   Margaret Hunter mother: died at 71.  She had a suspicious PAP smear.  As a result, she had some surgery and they found some cancer cells in her uterus.  Her doctors believed these cancerous cells were 'hormone induced'  Maternal Aunts/Uncles: 1 maternal uncle, no history of cancer.  Maternal cousins: 6 maternal cousins, no known history of cancer,  But limited contact with these relatives.  Maternal grandfather: died in 49's no history of cancer.  Maternal grandmother:died in her 16's due to heart disease.  She had several sisters who lived to old age.   Margaret Hunter is unaware of previous family history of genetic testing for hereditary cancer risks. Patient's maternal ancestors are of Northern European descent, and paternal ancestors are of [place] European descent. There is no reported Ashkenazi Jewish ancestry. There is no known consanguinity.  GENETIC COUNSELING ASSESSMENT: Margaret Hunter is a 72 y.o. female with a personal and family history which is somewhat suggestive of a Hereditary Cancer Predisposition Syndrome. We, therefore, discussed and recommended the following at today's visit.   DISCUSSION: We reviewed the characteristics, features and inheritance patterns of hereditary cancer syndromes. We also discussed genetic testing, including the appropriate family members to test, the process of testing, insurance coverage and turn-around-time for results. We discussed the implications of a negative, positive and/or variant of uncertain significant result. In order to get genetic test results in a timely manner so that Margaret Hunter can use these genetic test results for surgical decisions, we recommended  Margaret Hunter pursue genetic testing for the Breast Cancer STAT Panel.   The STAT Breast cancer panel offered by [facility] includes sequencing and rearrangement analysis for the following 9 genes:  ATM, BRCA1, BRCA2, CDH1, CHEK2, PALB2, PTEN, STK11 and TP53.    We then recommend Margaret Hunter pursue reflex genetic testing to the Common Hereditary Cancers gene panel + Leukemia/Myelodysplastic Syndrome panel.   The Common Hereditary Cancer Panel offered by [facility] includes sequencing and/or deletion duplication testing of the following 47 genes: APC, ATM, AXIN2, BARD1, BMPR1A, BRCA1, BRCA2, BRIP1, CDH1, CDKN2A (p14ARF), CDKN2A (p16INK4a), CKD4, CHEK2, CTNNA1, DICER1, EPCAM (Deletion/duplication testing only), GREM1 (promoter region deletion/duplication testing only), KIT, MEN1, MLH1, MSH2, MSH3, MSH6, MUTYH, NBN, NF1, NHTL1, PALB2, PDGFRA, PMS2, POLD1, POLE, PTEN, RAD50, RAD51C, RAD51D, SDHB, SDHC,  SDHD, SMAD4, SMARCA4. STK11, TP53, TSC1, TSC2, and VHL.  The following genes were evaluated for sequence changes only: SDHA and HOXB13 c.251G>A variant only.  [facility] Myelodysplastic Syndrome/Leukemia Panel: ATM BLM CEBPA EPCAM GATA2 HRAS MLH1 MSH2 MSH6 NBN NF1 PMS2 RUNX1 TERC TERT TP53  We discussed that only 5-10% of cancers are associated with a Hereditary cancer predisposition syndrome.  One of the most common hereditary cancer syndromes that increases breast cancer risk is called Hereditary Breast and Ovarian Cancer (HBOC) syndrome.  This syndrome is caused by mutations in the BRCA1 and BRCA2 genes.  This syndrome increases an individual's lifetime risk to develop breast, ovarian, pancreatic, and other types of cancer.  There are also many other cancer predisposition syndromes caused by mutations in several other genes.  We discussed that if she is found to have a mutation in one of these genes, it may impact surgical decisions, and alter future medical management recommendations such as increased cancer  screenings and consideration of risk reducing surgeries.  A positive result could also have implications for the patient's family members.  A Negative result would mean we were unable to identify a hereditary component to her cancer, but does not rule out the possibility of a hereditary basis for her cancer.  There could be mutations that are undetectable by current technology, or in genes not yet tested or identified to increase cancer risk.    We discussed the potential to find a Variant of Uncertain Significance or VUS.  These are variants that have not yet been identified as pathogenic or benign, and it is unknown if this variant is associated with increased cancer risk or if this is a normal finding.  Most VUS's are reclassified to benign or likely benign.   It should not be used to make medical management decisions. With time, we suspect the lab will determine the significance of any VUS's identified if any.   Based on Margaret Hunter personal and family history of cancer, she meets medical criteria for genetic testing. Despite that she meets criteria, she may still have an out of pocket cost. We discussed that if her out of pocket cost for testing is over $100, the laboratory will call and confirm whether she wants to proceed with testing.  We discussed medicare patients typically have $0 OOP cost.   PLAN: After considering the risks, benefits, and limitations, Margaret Hunter  provided informed consent to pursue genetic testing and the blood sample was sent to [facility] for analysis of the Breast Cancer STAT panel with plans to reflex to the Common Hereditary Cancers panel + Myelodysplastic/Leukemia panel. Preliminary results should be available within approximately 5-12 days' time, at which point they will be disclosed by telephone to Margaret Hunter, as will any additional recommendations warranted by these results. Margaret Hunter will receive a summary of her genetic counseling visit and a copy of her  results once available. This information will also be available in Epic. We encouraged Margaret Hunter. Murley to remain in contact with cancer genetics annually so that we can continuously update the family history and inform her of any changes in cancer genetics and testing that may be of benefit for her family. Margaret Hunter. Cavalieri questions were answered to her satisfaction today. Our contact information was provided should additional questions or concerns arise.  Based on Margaret Hunter. Gilkerson's family history, we recommended her sister with a history of ovarian cancer, have genetic counseling and testing. Margaret Hunter. Gause will let us know if we can be of any  assistance in coordinating genetic counseling and/or testing for this family member.   Lastly, we encouraged Margaret Hunter. Whitcomb to remain in contact with cancer genetics annually so that we can continuously update the family history and inform her of any changes in cancer genetics and testing that may be of benefit for this family.   Margaret Hunter.  [place] questions were answered to her satisfaction today. Our contact information was provided should additional questions or concerns arise. Thank you for the referral and allowing [place] to share in the care of your patient.   Tana Felts, Margaret Hunter, [facility] Certified Genetic Counselor [email or web address] phone: [phone]  The patient was seen for a total of 40 minutes in face-to-face genetic counseling.  This patient was discussed with Drs. Magrinat, Margaret Hunter and/or Burr Medico who agrees with the above.

## 2018-02-09 ENCOUNTER — Encounter: Payer: Self-pay | Admitting: Physical Therapy

## 2018-02-09 ENCOUNTER — Ambulatory Visit: Payer: Medicare Other | Admitting: Physical Therapy

## 2018-02-09 DIAGNOSIS — M25511 Pain in right shoulder: Secondary | ICD-10-CM

## 2018-02-09 DIAGNOSIS — Z170 Estrogen receptor positive status [ER+]: Secondary | ICD-10-CM | POA: Diagnosis not present

## 2018-02-09 DIAGNOSIS — G8929 Other chronic pain: Secondary | ICD-10-CM | POA: Diagnosis not present

## 2018-02-09 DIAGNOSIS — M25611 Stiffness of right shoulder, not elsewhere classified: Secondary | ICD-10-CM | POA: Diagnosis not present

## 2018-02-09 DIAGNOSIS — R293 Abnormal posture: Secondary | ICD-10-CM

## 2018-02-09 DIAGNOSIS — C50211 Malignant neoplasm of upper-inner quadrant of right female breast: Secondary | ICD-10-CM | POA: Diagnosis not present

## 2018-02-09 NOTE — Patient Instructions (Addendum)
Strengthening: Resisted Flexion   Cancer Rehab [phone]    Hold tubing with right arm at side. Pull forward and up. Move shoulder through pain-free range of motion. Repeat __10__ times per set. Do _1-2___ sessions per day.  Strengthening: Resisted Internal Rotation    Hold tubing in right hand, elbow at side and forearm out. Rotate forearm in across body. Repeat __10__ times per set. Do _1-2___ sessions per day.  Strengthening: Resisted Extension    Hold tubing in right hand, arm forward. Pull arm back, elbow straight. Repeat _10___ times per set. Do _1-2___ sessions per day.  Strengthening: Resisted External Rotation - Only do this one on the left since R side is painful    Hold tubing in right hand, elbow at side and forearm across body. Rotate forearm out. Repeat _10___ times per set. Do __1-2__ sessions per day.   Repeat all exercises on left side  SHOULDER: External Rotation (Isometric) - Do on right side    Using wall as resistance, press back of wrist against pillow. Keep elbow bent. Hold _5-10__ seconds. _5-10__ reps per set, __1-2_ sets per day, _7__ days per week  Copyright  VHI. All rights reserved.

## 2018-02-09 NOTE — Therapy (Signed)
[place], [place], [address] Phone: [phone]   Fax:  [phone]  Physical Therapy Treatment  Patient Details  Name: Margaret Hunter MRN: [id number] Date of Birth: August 27, 1946 Referring Provider: Dr. Excell Seltzer   Encounter Date: 02/09/2018  PT End of Session - 02/09/18 1038    Visit Number  3    Number of Visits  8    Date for PT Re-Evaluation  04/01/18    PT Start Time  0807    PT Stop Time  0847    PT Time Calculation (min)  40 min    Activity Tolerance  Patient tolerated treatment well    Behavior During Therapy  [facility] for tasks assessed/performed       Past Medical History:  Diagnosis Date  . Family history of ovarian cancer     Past Surgical History:  Procedure Laterality Date  . CESAREAN SECTION    . HERNIA REPAIR      There were no vitals filed for this visit.  Subjective Assessment - 02/09/18 0807    Subjective  My right shoulder has not ached as much as it did before. I think the patch helped. I applied ice, tiger balm and also did some massage.     Pertinent History  Patient was diagnosed on 01/09/18 with right grade 1-2 invasive ductal carcinoma breast cancer. It measures 5 mm and is located in the upper inner quadrant. It is ER/PR positive and HER2 negative with a Ki67 of 20%.    Patient Stated Goals  Reduce lymphedema risk, learn post op shoulder ROM HEP, decrease right shoulder pain    Currently in Pain?  No/denies    Pain Score  0-No pain                       [facility] Adult PT Treatment/Exercise - 02/09/18 0001      Shoulder Exercises: Standing   External Rotation  Strengthening;10 reps;Theraband;Left pt returned therapist demo, on right did isometrics    Internal Rotation  Strengthening;Both;10 reps;Theraband pt returned therapist demo    Flexion  Strengthening;Both;10 reps;Theraband pt returned therapist demo    Theraband Level (Shoulder Flexion)   Level 1 (Yellow)    Extension  Strengthening;Both;10 reps;Theraband pt returned therapist demo    Theraband Level (Shoulder Extension)  Level 1 (Yellow)    Other Standing Exercises  R shoulder isometric 5 sec holds x 5      Shoulder Exercises: Pulleys   Flexion  2 minutes pt returned therapist demo    ABduction  2 minutes pt returned therapist demo      Shoulder Exercises: Therapy Ball   Flexion  Both;10 reps with stretch at top, pt returned therapist demo      Iontophoresis   Type of Iontophoresis  Dexamethasone    Location  right anterior shoulder in area of P!    Dose  '4mg'$ /mL    Time  4-6 hours- issued brochure and instructed pt to remove sooner if there is any increased irritation      Manual Therapy   Manual Therapy  Myofascial release    Myofascial Release  cross friction massage to tendons in anterior shoulder where pt has been having pain and discomfort                  PT Long Term Goals - 02/04/18 1204      PT LONG TERM GOAL #1   Title  Patient will demonstrate she has returned to baseline related to shoulder function and ROM post operatively.    Time  8    Period  Weeks    Status  New      PT LONG TERM GOAL #2   Title  Patient will increase right shoulder active internal rotation to >/= 50 degrees for increased function.    Baseline  31 degrees    Time  4    Period  Weeks    Status  New      PT LONG TERM GOAL #3   Title  Patient will increase right shoulder flexion to >/= 140 degrees for increased ease reaching overhead.    Baseline  125 degrees    Time  4    Period  Weeks    Status  New      PT LONG TERM GOAL #4   Title  Patient will report >/= 50% reduction in right shoulder pain.    Time  4    Period  Weeks    Status  New      Breast Clinic Goals - 02/04/18 1204      Patient will be able to verbalize understanding of pertinent lymphedema risk reduction practices relevant to her diagnosis specifically related to skin care.   Time  1     Period  Days    Status  Achieved      Patient will be able to return demonstrate and/or verbalize understanding of the post-op home exercise program related to regaining shoulder range of motion.   Time  1    Period  Days    Status  Achieved      Patient will be able to verbalize understanding of the importance of attending the postoperative After Breast Cancer Class for further lymphedema risk reduction education and therapeutic exercise.   Time  1    Period  Days    Status  Achieved           Plan - 02/09/18 1038    Clinical Impression Statement  Pt stated the ionto patch helped last time and she has less pain over the weekend. To avoid irritation of the tendons, instructed pt in standing rockwood exercises to shoulder height for strengthening. Issued these to pt as part of an HEP. Pt had pain with standing right shoulder ER with yellow band so instructed to pt to replace that exercise with isometric external rotation which pt did not have any pain with. Instructed pt to do these 1x/day. Applied ionto again today and encouraged pt to continue ice and massage at home.     Rehab Potential  Excellent    Clinical Impairments Affecting Rehab Potential  None    PT Frequency  2x / week    PT Duration  4 weeks    PT Treatment/Interventions  ADLs/Self Care Home Management;Moist Heat;Iontophoresis '4mg'$ /ml Dexamethasone;Therapeutic activities;Therapeutic exercise;Patient/family education;Manual techniques;Passive range of motion;Taping;Dry needling    PT Next Visit Plan  Pulleys, ROM exercises (supine cane?), PROM, soft tissue work, see if ionto helped and continue, ice massage- begin supine scap if no discomfort    PT Home Exercise Plan  Post op shoulder ROM HEP    Consulted and Agree with Plan of Care  Patient       Patient will benefit from skilled therapeutic intervention in order to improve the following deficits and impairments:  Decreased knowledge of precautions, Impaired UE functional  use, Decreased strength, Decreased range of motion, Postural dysfunction,  Pain  Visit Diagnosis: Abnormal posture  Chronic right shoulder pain  Stiffness of right shoulder, not elsewhere classified     Problem List Patient Active Problem List   Diagnosis Date Noted  . Family history of ovarian cancer   . Malignant neoplasm of upper-inner quadrant of right breast in female, estrogen receptor positive ([place]) 01/30/2018    Allyson Sabal [facility] 02/09/2018, 10:42 AM  [place], [place], [address] Phone: [phone]   Fax:  [phone]  Name: LARIYAH SHETTERLY MRN: [id number] Date of Birth: [birth year]  Manus Gunning, PT 02/09/18 10:42 AM

## 2018-02-11 ENCOUNTER — Telehealth: Payer: Self-pay | Admitting: Hematology and Oncology

## 2018-02-11 ENCOUNTER — Other Ambulatory Visit: Payer: Self-pay

## 2018-02-11 ENCOUNTER — Encounter (HOSPITAL_BASED_OUTPATIENT_CLINIC_OR_DEPARTMENT_OTHER): Payer: Self-pay | Admitting: *Deleted

## 2018-02-11 NOTE — Telephone Encounter (Signed)
Mailed patient calendar of upcoming may appointments per 5/15 sch message

## 2018-02-12 ENCOUNTER — Telehealth: Payer: Self-pay | Admitting: *Deleted

## 2018-02-12 ENCOUNTER — Encounter: Payer: Self-pay | Admitting: Physical Therapy

## 2018-02-12 ENCOUNTER — Ambulatory Visit: Payer: Medicare Other | Admitting: Physical Therapy

## 2018-02-12 DIAGNOSIS — G8929 Other chronic pain: Secondary | ICD-10-CM

## 2018-02-12 DIAGNOSIS — Z170 Estrogen receptor positive status [ER+]: Secondary | ICD-10-CM | POA: Diagnosis not present

## 2018-02-12 DIAGNOSIS — C50211 Malignant neoplasm of upper-inner quadrant of right female breast: Secondary | ICD-10-CM | POA: Diagnosis not present

## 2018-02-12 DIAGNOSIS — M25511 Pain in right shoulder: Secondary | ICD-10-CM

## 2018-02-12 DIAGNOSIS — R293 Abnormal posture: Secondary | ICD-10-CM

## 2018-02-12 DIAGNOSIS — M25611 Stiffness of right shoulder, not elsewhere classified: Secondary | ICD-10-CM | POA: Diagnosis not present

## 2018-02-12 NOTE — Patient Instructions (Signed)

## 2018-02-12 NOTE — Telephone Encounter (Signed)
Spoke to pt concerning [facility] from 5.9.19. Denies questions or concerns regarding dx or treatment care plan. Encourage pt to call with needs. Received verbal understanding.

## 2018-02-12 NOTE — Therapy (Signed)
[place], [place], [address] Phone: [phone]   Fax:  [phone]  Physical Therapy Treatment  Patient Details  Name: Margaret Hunter MRN: [id number] Date of Birth: 1946/03/13 Referring Provider: Dr. Excell Seltzer   Encounter Date: 02/12/2018  PT End of Session - 02/12/18 1720    Visit Number  4    Number of Visits  8    Date for PT Re-Evaluation  04/01/18    PT Start Time  5427    PT Stop Time  1600    PT Time Calculation (min)  45 min    Activity Tolerance  Patient tolerated treatment well    Behavior During Therapy  [name] for tasks assessed/performed       Past Medical History:  Diagnosis Date  . Cancer ([place]) 01/2018   right breast cancer  . Family history of ovarian cancer   . Hypertension     Past Surgical History:  Procedure Laterality Date  . CESAREAN SECTION    . HERNIA REPAIR Right    IHR    There were no vitals filed for this visit.  Subjective Assessment - 02/12/18 1526    Subjective   There are some things that I do that cause pain and some that don't .  He surgery is scheduled for May 24     Pertinent History  Patient was diagnosed on 01/09/18 with right grade 1-2 invasive ductal carcinoma breast cancer. It measures 5 mm and is located in the upper inner quadrant. It is ER/PR positive and HER2 negative with a Ki67 of 20%. She does not have to have chemo and may  not need radiation     Patient Stated Goals  Reduce lymphedema risk, learn post op shoulder ROM HEP, decrease right shoulder pain    Currently in Pain?  No/denies                       [facility] Adult PT Treatment/Exercise - 02/12/18 0001      Shoulder Exercises: Supine   Protraction  Strengthening;10 reps;Weights    Protraction Weight (lbs)  2    Horizontal ABduction  Strengthening;Both;5 reps;Theraband    Theraband Level (Shoulder Horizontal ABduction)  Level 2 (Red)    External Rotation   Strengthening;Right;Left;5 reps;Theraband    Theraband Level (Shoulder External Rotation)  Level 2 (Red)    Flexion  Strengthening;Right;Left;5 reps;Theraband    Theraband Level (Shoulder Flexion)  Level 2 (Red)    Diagonals  Strengthening;Right;Left;5 reps;Theraband    Theraband Level (Shoulder Diagonals)  Level 2 (Red)    Other Supine Exercises  dowel rod flexion x 10 reps       Iontophoresis   Type of Iontophoresis  Dexamethasone    Location  right anterior shoulder in area of P!    Dose  '4mg'$ /mL    Time  4-6 hours-                  PT Long Term Goals - 02/04/18 1204      PT LONG TERM GOAL #1   Title  Patient will demonstrate she has returned to baseline related to shoulder function and ROM post operatively.    Time  8    Period  Weeks    Status  New      PT LONG TERM GOAL #2   Title  Patient will increase right shoulder active internal rotation to >/= 50 degrees for increased function.  Baseline  31 degrees    Time  4    Period  Weeks    Status  New      PT LONG TERM GOAL #3   Title  Patient will increase right shoulder flexion to >/= 140 degrees for increased ease reaching overhead.    Baseline  125 degrees    Time  4    Period  Weeks    Status  New      PT LONG TERM GOAL #4   Title  Patient will report >/= 50% reduction in right shoulder pain.    Time  4    Period  Weeks    Status  New      Breast Clinic Goals - 02/04/18 1204      Patient will be able to verbalize understanding of pertinent lymphedema risk reduction practices relevant to her diagnosis specifically related to skin care.   Time  1    Period  Days    Status  Achieved      Patient will be able to return demonstrate and/or verbalize understanding of the post-op home exercise program related to regaining shoulder range of motion.   Time  1    Period  Days    Status  Achieved      Patient will be able to verbalize understanding of the importance of attending the postoperative After  Breast Cancer Class for further lymphedema risk reduction education and therapeutic exercise.   Time  1    Period  Days    Status  Achieved           Plan - 02/12/18 1721    Clinical Impression Statement  Pt reports she is doing better and wants to get her shoulder stronger  She also wants to get rid of the inflammation if she can so she wants to have the into patch applied again.  Added supine scapular series to home program     Rehab Potential  Excellent    Clinical Impairments Affecting Rehab Potential  None    PT Frequency  2x / week    PT Duration  4 weeks    PT Treatment/Interventions  ADLs/Self Care Home Management;Moist Heat;Iontophoresis '4mg'$ /ml Dexamethasone;Therapeutic activities;Therapeutic exercise;Patient/family education;Manual techniques;Passive range of motion;Taping;Dry needling    PT Next Visit Plan  progressive strengthening consider teaching isometrics for deltoid strength  soft tissue work, see if ionto helped and continue, ice massage    PT Home Exercise Plan  Post op shoulder ROM HEP, supine scapular series     Consulted and Agree with Plan of Care  Patient       Patient will benefit from skilled therapeutic intervention in order to improve the following deficits and impairments:  Decreased knowledge of precautions, Impaired UE functional use, Decreased strength, Decreased range of motion, Postural dysfunction, Pain  Visit Diagnosis: Abnormal posture  Chronic right shoulder pain  Stiffness of right shoulder, not elsewhere classified     Problem List Patient Active Problem List   Diagnosis Date Noted  . Family history of ovarian cancer   . Malignant neoplasm of upper-inner quadrant of right breast in female, estrogen receptor positive ([place]) 01/30/2018   [name]  [place] [facility]o 02/12/2018, 5:24 PM  [place], [place], [address] Phone: [phone]   Fax:   [phone]  Name: Margaret Hunter MRN: [id number] Date of Birth: Nov 27, 1945

## 2018-02-13 ENCOUNTER — Encounter: Payer: Self-pay | Admitting: Radiation Oncology

## 2018-02-13 ENCOUNTER — Telehealth: Payer: Self-pay | Admitting: Genetics

## 2018-02-13 ENCOUNTER — Encounter (HOSPITAL_BASED_OUTPATIENT_CLINIC_OR_DEPARTMENT_OTHER)
Admission: RE | Admit: 2018-02-13 | Discharge: 2018-02-13 | Disposition: A | Discharge status: Home / Self Care | Payer: Medicare Other | Source: Ambulatory Visit | Attending: General Surgery | Admitting: General Surgery

## 2018-02-13 DIAGNOSIS — I10 Essential (primary) hypertension: Secondary | ICD-10-CM | POA: Insufficient documentation

## 2018-02-13 NOTE — Progress Notes (Signed)
Ensure pre surgery drink given with instructions to complete by 0530 dos, surgical soap given with instructions, pt verbalized understanding. 

## 2018-02-13 NOTE — Telephone Encounter (Signed)
Revealed negative genetic testing.   This normal result is reassuring and indicates that it is unlikely Margaret Hunter's cancer is due to a hereditary cause.  It is unlikely that there is an increased risk of another cancer due to a mutation in one of these genes.  However, genetic testing is not perfect, and cannot definitively rule out a hereditary cause.  It will be important for her to keep in contact with genetics to learn if any additional testing may be needed in the future.     Recommended family members tell their doctors about the family history of cancer so individuals screening recommendations can be made for them.  Recommended sister with ovarian cancer or her other relatives also have genetic testing.

## 2018-02-16 DIAGNOSIS — C50211 Malignant neoplasm of upper-inner quadrant of right female breast: Secondary | ICD-10-CM | POA: Diagnosis not present

## 2018-02-17 ENCOUNTER — Encounter: Payer: Self-pay | Admitting: Genetics

## 2018-02-17 ENCOUNTER — Ambulatory Visit: Payer: Self-pay | Admitting: Genetics

## 2018-02-17 ENCOUNTER — Ambulatory Visit: Payer: Medicare Other | Admitting: Physical Therapy

## 2018-02-17 DIAGNOSIS — Z8041 Family history of malignant neoplasm of ovary: Secondary | ICD-10-CM

## 2018-02-17 DIAGNOSIS — Z170 Estrogen receptor positive status [ER+]: Principal | ICD-10-CM

## 2018-02-17 DIAGNOSIS — Z1379 Encounter for other screening for genetic and chromosomal anomalies: Secondary | ICD-10-CM

## 2018-02-17 DIAGNOSIS — C50211 Malignant neoplasm of upper-inner quadrant of right female breast: Secondary | ICD-10-CM

## 2018-02-17 NOTE — Progress Notes (Signed)
HPI:  Margaret Hunter was previously seen in the [place] clinic on 02/06/2018 due to a personal and family history of cancer and concerns regarding a hereditary predisposition to cancer. Please refer to our prior cancer genetics clinic note for more information regarding Margaret Hunter's medical, social and family histories, and our assessment and recommendations, at the time. Margaret Hunter recent genetic test results were disclosed to her, as well as recommendations warranted by these results. These results and recommendations are discussed in more detail below.  CANCER HISTORY:    Malignant neoplasm of upper-inner quadrant of right breast in female, estrogen receptor positive ([place])   01/30/2018 Initial Diagnosis    Screening detected right breast mass 0.5 cm at 1 o'clock position 9 cm from nipple, axilla negative biopsy: Invasive ductal carcinoma and a papillary lesion grade 1-2, ER PR positive HER-2 negative Ki-67 20%, T1 a N0 stage I a clinical stage AJCC 8      02/13/2018 Genetic Testing    The Common Hereditary Cancers Panel + Myelodysplastic Syndrome/Leukemia Panel was ordered (54 genes) The following genes were evaluated for sequence changes and exonic deletions/duplications: APC, ATM, AXIN2, BARD1, BLM, BMPR1A, BRCA1, BRCA2, BRIP1, CDH1, CDK4, CDKN2A (p14ARF), CDKN2A (p16INK4a), CEBPA, CHEK2, CTNNA1, DICER1, EPCAM*, GATA2, GREM1*, HRAS, KIT, MEN1, MLH1, MSH2, MSH3, MSH6, MUTYH, NBN, NF1, PALB2, PDGFRA, PMS2, POLD1, POLE, PTEN, RAD50, RAD51C, RAD51D, RUNX1, SDHB, SDHC, SDHD, SMAD4, SMARCA4, STK11, TERC, TERT, TP53, TSC1, TSC2, VHL The following genes were evaluated for sequence changes only:HOXB13*, NTHL1*, SDHA  Results: Negative, no pathogenic variants identified.  The date of this test report is 02/13/2018.         FAMILY HISTORY:  We obtained a detailed, 4-generation family history.  Significant diagnoses are listed below: Family History  Problem Relation Age of Onset  .  Uterine cancer Mother        found some cancer cells in uterus - was told it was 'hormone induced'  . Ovarian cancer Sister 13       caught early (found incidentially during surgery), had chemo  . Leukemia Brother 5  . Heart disease Maternal Grandmother 38    Margaret Hunter has a daughter and 2 sons ages 48-50.  Margaret Hunter also has 8 grandchildren. Margaret Hunter had 1 brother who died at 68 years old due to leukemia dx at 73 years of age.  Margaret Hunter sister was diagnosed with ovarian cancer at 45.  This was found incidentally, and therefore was found early.  She had chemotherapy and is still living at 57.  She has no chidren.   Margaret Hunter father: died at 96 due to emphysema.  He had a brain tumor that was not cancer.  Paternal aunts/Uncles: 7 paternal aunts, 6 paternal uncles, no known history of cancer. Dimentia runs in this side of the family.  Paternal cousins: unknown Paternal grandfather: lived into his 70's with no known history of cancer.  Paternal grandmother:lived into her 26's with no known history of cancer.   Margaret Hunter mother: died at 62.  She had a suspicious PAP smear.  As a result, she had some surgery and they found some cancer cells in her uterus.  Her doctors believed these cancerous cells were 'hormone induced'  Maternal Aunts/Uncles: 1 maternal uncle, no history of cancer.  Maternal cousins: 6 maternal cousins, no known history of cancer,  But limited contact with these relatives.  Maternal grandfather: died in 47's no history of cancer.  Maternal grandmother:died in her 64's  due to heart disease.  She had several sisters who lived to old age.   Margaret Hunter is unaware of previous family history of genetic testing for hereditary cancer risks. Patient's maternal ancestors are of Northern European descent, and paternal ancestors are of [place] European descent. There is no reported Ashkenazi Jewish ancestry. There is no known consanguinity.  GENETIC TEST RESULTS: Genetic  testing performed through [facility] Common Hereditary Cancers Panel + Myelodysplastic Syndrome/Leukemia Panel reported out on 02/13/2018 showed no pathogenic mutations. The following genes were evaluated for sequence changes and exonic deletions/duplications: APC, ATM, AXIN2, BARD1, BLM, BMPR1A, BRCA1, BRCA2, BRIP1, CDH1, CDK4, CDKN2A (p14ARF), CDKN2A (p16INK4a), CEBPA, CHEK2, CTNNA1, DICER1, EPCAM*, GATA2, GREM1*, HRAS, KIT, MEN1, MLH1, MSH2, MSH3, MSH6, MUTYH, NBN, NF1, PALB2, PDGFRA, PMS2, POLD1, POLE, PTEN, RAD50, RAD51C, RAD51D, RUNX1, SDHB, SDHC, SDHD, SMAD4, SMARCA4, STK11, TERC, TERT, TP53, TSC1, TSC2, VHL. The following genes were evaluated for sequence changes only:HOXB13*, NTHL1*, SDHA.  The test report will be scanned into EPIC and will be located under the Molecular Pathology section of the Results Review tab. A portion of the result report is included below for reference.     We discussed with Margaret Hunter that because current genetic testing is not perfect, it is possible there may be a gene mutation in one of these genes that current testing cannot detect, but that chance is small.  We also discussed, that there could be another gene that has not yet been discovered, or that we have not yet tested, that is responsible for the cancer diagnoses in the family. It is also possible there is a hereditary cause for the cancer in the family that Margaret Hunter did not inherit and therefore was not identified in her testing.  Therefore, it is important to remain in touch with cancer genetics in the future so that we can continue to offer Margaret Hunter the most up to date genetic testing.   ADDITIONAL GENETIC TESTING: We discussed with Margaret Hunter that there are other genes that are associated with increased cancer risk that can be analyzed. The laboratories that offer this testing look at these additional genes via a hereditary cancer gene panel. Should Margaret Hunter wish to pursue additional genetic testing, we  are happy to discuss and coordinate this testing, at any time.    CANCER SCREENING RECOMMENDATIONS: Margaret Hunter test result is negative (normal).  This means that we have not identified a hereditary cause for her personal and family history of cancer at this time.   While reassuring, this does not definitively rule out a hereditary predisposition to cancer. It is still possible that there could be genetic mutations that are undetectable by current technology, or genetic mutations in genes that have not been tested or identified to increase cancer risk.  Therefore, it is recommended she continue to follow the cancer management and screening guidelines provided by her oncology and primary healthcare provider. An individual's cancer risk is not determined by genetic test results alone.  Overall cancer risk assessment includes additional factors such as personal medical history, family history, etc.  These should be used to make a personalized plan for cancer prevention and surveillance.    RECOMMENDATIONS FOR FAMILY MEMBERS:  Relatives in this family might be at some increased risk of developing cancer, over the general population risk, simply due to the family history of cancer.  We recommended women in this family have a yearly mammogram beginning at age 52, or 70 years younger than the earliest onset of  cancer, an annual clinical breast exam, and perform monthly breast self-exams. Women in this family should also have a gynecological exam as recommended by their primary provider. All family members should have a colonoscopy by age 40 (or as directed by their doctors).  All family members should inform their physicians about the family history of cancer so their doctors can make the most appropriate screening recommendations for them.    It is also possible there is a hereditary cause for the cancer in Margaret Hunter's family that she did not inherit and therefore was not identified in her.  Therefore, we  recommended her sister who had ovarian cancer (or her relatives) also have genetic counseling and testing. Ms. Wester will let us know if we can be of any assistance in coordinating genetic counseling and/or testing for these family members.   FOLLOW-UP: Lastly, we discussed with Ms. Stormer that cancer genetics is a rapidly advancing field and it is possible that new genetic tests will be appropriate for her and/or her family members in the future. We encouraged her to remain in contact with cancer genetics on an annual basis so we can update her personal and family histories and let her know of advances in cancer genetics that may benefit this family.   Our contact number was provided. Ms. Eke questions were answered to her satisfaction, and she knows she is welcome to call us at anytime with additional questions or concerns.   Ferol Luz, MS, [facility] Certified Genetic Counselor [email or web address]

## 2018-02-19 ENCOUNTER — Encounter: Payer: Self-pay | Admitting: Rehabilitation

## 2018-02-19 NOTE — H&P (Signed)
History of Present Illness Margaret Hunter T. Tarris Delbene MD; 02/04/2018 10:17 AM) The patient is a 72 year old female who presents with breast cancer. She is a postmenopausal female referred by Dr. Emmit Pomfret for evaluation of recently diagnosed carcinoma of the right breast. She recently presented for a screening mamogram revealing a new small mass in the right breast. Subsequent imaging included diagnostic mamogram confirming a small mass and ultrasound showing a 5 mm oval mass at the 1:00 position posterior depth. axilla was negative. An ultrasound guided breast biopsy was performed on 01/27/2018 with pathology revealing invasive ductal carcinoma of the breast. She is seen now in breast multidisciplinary clinic for initial treatment planning. She has experienced no breast symptoms, specifically mass or nipple discharge or skin changes. She does not have a personal history of any previous breast problems.  Findings at that time were the following: Tumor size: 0.5 cm Tumor grade: 1-2, Ki-67 20% Estrogen Receptor: 100% positive Progesterone Receptor: 95% positive Her-2 neu: negative Lymph node status: negative    Past Surgical History Tawni Pummel, RN; 02/04/2018 7:34 AM) Breast Biopsy  Right. Colon Polyp Removal - Colonoscopy  Open Inguinal Hernia Surgery  Right. Oral Surgery   Diagnostic Studies History Tawni Pummel, RN; 02/04/2018 7:34 AM) Colonoscopy  >10 years ago  Allergies Margaret Hunter T. Lailie Smead, MD; 02/04/2018 10:19 AM) Penicillin G Benzathine & Proc *PENICILLINS*   Medication History Margaret Hunter T. Kyrianna Barletta, MD; 02/04/2018 10:18 AM) Medications Reconciled Zocor ('40MG'$  Tablet, Oral daily) Active. Losartan Potassium-HCTZ (100-'25MG'$  Tablet, Oral daily) Active.  Social History Tawni Pummel, RN; 02/04/2018 7:34 AM) Alcohol use  Moderate alcohol use. Caffeine use  Carbonated beverages, Tea. No drug use  Tobacco use  Never smoker.  Family History Tawni Pummel, RN;  02/04/2018 7:34 AM) Arthritis  Mother. Cancer  Family Members In General. Cerebrovascular Accident  Family Members In General, Mother, Son. Hypertension  Mother. Melanoma  Family Members In General. Ovarian Cancer  Sister. Respiratory Condition  Father. Seizure disorder  Father.  Other Problems Tawni Pummel, RN; 02/04/2018 7:34 AM) Breast Cancer  Cholelithiasis  Heart murmur  Hemorrhoids  High blood pressure  Hypercholesterolemia  Inguinal Hernia  Lump In Breast  Melanoma  Migraine Headache     Review of Systems Sunday Spillers Ledford RN; 02/04/2018 7:34 AM) General Not Present- Appetite Loss, Chills, Fatigue, Fever, Night Sweats, Weight Gain and Weight Loss. Skin Not Present- Change in Wart/Mole, Dryness, Hives, Jaundice, New Lesions, Non-Healing Wounds, Rash and Ulcer. HEENT Present- Wears glasses/contact lenses. Not Present- Earache, Hearing Loss, Hoarseness, Nose Bleed, Oral Ulcers, Ringing in the Ears, Seasonal Allergies, Sinus Pain, Sore Throat, Visual Disturbances and Yellow Eyes. Respiratory Present- Snoring. Not Present- Bloody sputum, Chronic Cough, Difficulty Breathing and Wheezing. Breast Not Present- Breast Mass, Breast Pain, Nipple Discharge and Skin Changes. Cardiovascular Not Present- Chest Pain, Difficulty Breathing Lying Down, Leg Cramps, Palpitations, Rapid Heart Rate, Shortness of Breath and Swelling of Extremities. Gastrointestinal Not Present- Abdominal Pain, Bloating, Bloody Stool, Change in Bowel Habits, Chronic diarrhea, Constipation, Difficulty Swallowing, Excessive gas, Gets full quickly at meals, Hemorrhoids, Indigestion, Nausea, Rectal Pain and Vomiting. Musculoskeletal Present- Muscle Pain and Muscle Weakness. Not Present- Back Pain, Joint Pain, Joint Stiffness and Swelling of Extremities. Neurological Not Present- Decreased Memory, Fainting, Headaches, Numbness, Seizures, Tingling, Tremor, Trouble walking and Weakness. Psychiatric Not Present-  Anxiety, Bipolar, Change in Sleep Pattern, Depression, Fearful and Frequent crying. Endocrine Not Present- Cold Intolerance, Excessive Hunger, Hair Changes, Heat Intolerance, Hot flashes and New Diabetes. Hematology Present- Blood Thinners. Not Present- Easy  Bruising, Excessive bleeding, Gland problems, HIV and Persistent Infections.   Physical Exam Margaret Hunter T. Langford Carias MD; 02/04/2018 10:20 AM) The physical exam findings are as follows: Note:General: Alert, moderately obese Caucasian female, in no distress Skin: Warm and dry without rash or infection. HEENT: No palpable masses or thyromegaly. Sclera nonicteric. Lymph nodes: No cervical, supraclavicular nodes palpable. Breasts: No palpable masses in either breast. No skin changes lr niadenopathy.ng or inversion. No palpable axillary adenopathy. Lungs: Breath sounds clear and equal. No wheezing or increased work of breathing. Cardiovascular: Regular rate and rhythm without murmer. No JVD or edema. Abdomen: Nondistended. Soft and nontender. No masses palpable. No organomegaly. No palpable hernias. Extremities: No edema or joint swelling or deformity. No chronic venous stasis changes. Neurologic: Alert and fully oriented. Gait normal. No focal weakness. Psychiatric: Normal mood and affect. Thought content appropriate with normal judgement and insight    Assessment & Plan Margaret Hunter T. Kassia Demarinis MD; 02/04/2018 10:23 AM) MALIGNANT NEOPLASM OF RIGHT BREAST, STAGE 1, ESTROGEN RECEPTOR POSITIVE (C50.911) Impression: 72 year old female with a new diagnosis of cancer of the ight breast, upper inner quadrant. Clinical stage one a,ER/PR positive, HER-2 negative. I discussed with the patient initial surgical treatment options. We discussed options of breast conservation with lumpectomy or total mastectomy and sentinal lymph node biopsy/dissection. Options for reconstruction were discussed. she would appear to be an ideal candidate for breast conservation. After  discussion they have elected to proceed with breast conservation with lumpectomy and sentinel lymph node biopsy. We discussed the indications and nature of the procedure, and expected recovery, in detail. Surgical risks including anesthetic complications, cardiorespiratory complications, bleeding, infection, wound healing complications, blood clots, lymphedema, local and distant recurrence and possible need for further surgery based on the final pathology was discussed and understood. Chemotherapy, hormonal therapy and radiation therapy have been discussed. They have been provided with literature regarding the treatment of breast cancer. All questions were answered. she will undergo geon about surgeryt this would not change her decision about surgery. she understands and agree to proceed and we will go ahead with scheduling.  Current Plans radioactive seed localized right breast lumpectomy and right axillary sentinal lymph node  biopsy under general anesthesia as an outpatient

## 2018-02-20 ENCOUNTER — Ambulatory Visit (HOSPITAL_BASED_OUTPATIENT_CLINIC_OR_DEPARTMENT_OTHER)
Admission: RE | Admit: 2018-02-20 | Discharge: 2018-02-20 | Disposition: A | Discharge status: Home / Self Care | Payer: Medicare Other | Source: Ambulatory Visit | Attending: General Surgery | Admitting: General Surgery

## 2018-02-20 ENCOUNTER — Ambulatory Visit (HOSPITAL_BASED_OUTPATIENT_CLINIC_OR_DEPARTMENT_OTHER): Payer: Medicare Other | Admitting: Certified Registered"

## 2018-02-20 ENCOUNTER — Encounter (HOSPITAL_COMMUNITY)
Admission: RE | Admit: 2018-02-20 | Discharge: 2018-02-20 | Disposition: A | Discharge status: Home / Self Care | Payer: Medicare Other | Source: Ambulatory Visit | Attending: General Surgery | Admitting: General Surgery

## 2018-02-20 ENCOUNTER — Encounter (HOSPITAL_BASED_OUTPATIENT_CLINIC_OR_DEPARTMENT_OTHER): Payer: Self-pay | Admitting: Certified Registered"

## 2018-02-20 ENCOUNTER — Encounter (HOSPITAL_BASED_OUTPATIENT_CLINIC_OR_DEPARTMENT_OTHER)
Admission: RE | Disposition: A | Discharge status: Home / Self Care | Payer: Self-pay | Source: Ambulatory Visit | Attending: General Surgery

## 2018-02-20 DIAGNOSIS — R011 Cardiac murmur, unspecified: Secondary | ICD-10-CM | POA: Insufficient documentation

## 2018-02-20 DIAGNOSIS — E7800 Pure hypercholesterolemia, unspecified: Secondary | ICD-10-CM | POA: Diagnosis not present

## 2018-02-20 DIAGNOSIS — C50211 Malignant neoplasm of upper-inner quadrant of right female breast: Secondary | ICD-10-CM | POA: Diagnosis not present

## 2018-02-20 DIAGNOSIS — I10 Essential (primary) hypertension: Secondary | ICD-10-CM | POA: Diagnosis not present

## 2018-02-20 DIAGNOSIS — Z85820 Personal history of malignant melanoma of skin: Secondary | ICD-10-CM | POA: Insufficient documentation

## 2018-02-20 DIAGNOSIS — Z79899 Other long term (current) drug therapy: Secondary | ICD-10-CM | POA: Diagnosis not present

## 2018-02-20 DIAGNOSIS — Z7982 Long term (current) use of aspirin: Secondary | ICD-10-CM | POA: Diagnosis not present

## 2018-02-20 DIAGNOSIS — G8918 Other acute postprocedural pain: Secondary | ICD-10-CM | POA: Diagnosis not present

## 2018-02-20 DIAGNOSIS — Z880 Allergy status to penicillin: Secondary | ICD-10-CM | POA: Diagnosis not present

## 2018-02-20 DIAGNOSIS — C50911 Malignant neoplasm of unspecified site of right female breast: Secondary | ICD-10-CM

## 2018-02-20 DIAGNOSIS — Z170 Estrogen receptor positive status [ER+]: Secondary | ICD-10-CM | POA: Diagnosis not present

## 2018-02-20 HISTORY — DX: Malignant (primary) neoplasm, unspecified: C80.1

## 2018-02-20 HISTORY — DX: Essential (primary) hypertension: I10

## 2018-02-20 HISTORY — PX: BREAST LUMPECTOMY WITH RADIOACTIVE SEED AND SENTINEL LYMPH NODE BIOPSY: SHX6550

## 2018-02-20 SURGERY — BREAST LUMPECTOMY WITH RADIOACTIVE SEED AND SENTINEL LYMPH NODE BIOPSY
Anesthesia: General | Site: Breast | Laterality: Right

## 2018-02-20 MED ORDER — CHLORHEXIDINE GLUCONATE CLOTH 2 % EX PADS: 6.0000 | MEDICATED_PAD | Freq: Once | CUTANEOUS | Status: DC

## 2018-02-20 MED ORDER — MIDAZOLAM HCL 2 MG/2ML IJ SOLN
1.0000 mg | INTRAMUSCULAR | Status: DC | PRN
  Administered 2018-02-20: 2 mg via INTRAVENOUS

## 2018-02-20 MED ORDER — GABAPENTIN 300 MG PO CAPS
300.0000 mg | ORAL_CAPSULE | ORAL | Status: AC
  Administered 2018-02-20: 300 mg via ORAL

## 2018-02-20 MED ORDER — ACETAMINOPHEN 500 MG PO TABS
ORAL_TABLET | ORAL | Status: AC
  Filled 2018-02-20: qty 2

## 2018-02-20 MED ORDER — HYDROMORPHONE HCL 1 MG/ML IJ SOLN: 0.2500 mg | INTRAMUSCULAR | Status: DC | PRN

## 2018-02-20 MED ORDER — LIDOCAINE HCL (CARDIAC) PF 100 MG/5ML IV SOSY
PREFILLED_SYRINGE | INTRAVENOUS | Status: AC
Start: 2018-02-20 — End: ?
  Filled 2018-02-20: qty 20

## 2018-02-20 MED ORDER — BUPIVACAINE-EPINEPHRINE (PF) 0.5% -1:200000 IJ SOLN
INTRAMUSCULAR | Status: DC | PRN
  Administered 2018-02-20: 17 mL

## 2018-02-20 MED ORDER — CIPROFLOXACIN IN D5W 400 MG/200ML IV SOLN
INTRAVENOUS | Status: AC
Start: 2018-02-20 — End: ?
  Filled 2018-02-20: qty 200

## 2018-02-20 MED ORDER — ROPIVACAINE HCL 5 MG/ML IJ SOLN
INTRAMUSCULAR | Status: DC | PRN
  Administered 2018-02-20: 30 mL

## 2018-02-20 MED ORDER — METHYLENE BLUE 0.5 % INJ SOLN
INTRAVENOUS | Status: AC
  Filled 2018-02-20: qty 10

## 2018-02-20 MED ORDER — GABAPENTIN 300 MG PO CAPS
ORAL_CAPSULE | ORAL | Status: AC
  Filled 2018-02-20: qty 1

## 2018-02-20 MED ORDER — SCOPOLAMINE 1 MG/3DAYS TD PT72
MEDICATED_PATCH | TRANSDERMAL | Status: AC
  Filled 2018-02-20: qty 1

## 2018-02-20 MED ORDER — TECHNETIUM TC 99M SULFUR COLLOID FILTERED
1.0000 | Freq: Once | INTRAVENOUS | Status: AC | PRN
  Administered 2018-02-20: 1 via INTRADERMAL

## 2018-02-20 MED ORDER — FENTANYL CITRATE (PF) 100 MCG/2ML IJ SOLN
INTRAMUSCULAR | Status: AC
  Filled 2018-02-20: qty 2

## 2018-02-20 MED ORDER — ONDANSETRON HCL 4 MG/2ML IJ SOLN
INTRAMUSCULAR | Status: DC | PRN
  Administered 2018-02-20: 4 mg via INTRAVENOUS

## 2018-02-20 MED ORDER — PROPOFOL 500 MG/50ML IV EMUL
INTRAVENOUS | Status: AC
  Filled 2018-02-20: qty 50

## 2018-02-20 MED ORDER — 0.9 % SODIUM CHLORIDE (POUR BTL) OPTIME
TOPICAL | Status: DC | PRN
  Administered 2018-02-20: 300 mL

## 2018-02-20 MED ORDER — TRAMADOL HCL 50 MG PO TABS: 50.0000 mg | ORAL_TABLET | Freq: Four times a day (QID) | ORAL | 0 refills | Status: DC | PRN

## 2018-02-20 MED ORDER — DEXAMETHASONE SODIUM PHOSPHATE 10 MG/ML IJ SOLN
INTRAMUSCULAR | Status: AC
  Filled 2018-02-20: qty 3

## 2018-02-20 MED ORDER — MIDAZOLAM HCL 2 MG/2ML IJ SOLN
INTRAMUSCULAR | Status: AC
  Filled 2018-02-20: qty 2

## 2018-02-20 MED ORDER — CELECOXIB 200 MG PO CAPS
ORAL_CAPSULE | ORAL | Status: AC
  Filled 2018-02-20: qty 1

## 2018-02-20 MED ORDER — EPHEDRINE SULFATE 50 MG/ML IJ SOLN
INTRAMUSCULAR | Status: DC | PRN
  Administered 2018-02-20: 10 mg via INTRAVENOUS

## 2018-02-20 MED ORDER — PROMETHAZINE HCL 25 MG/ML IJ SOLN: 6.2500 mg | INTRAMUSCULAR | Status: DC | PRN

## 2018-02-20 MED ORDER — CELECOXIB 200 MG PO CAPS
200.0000 mg | ORAL_CAPSULE | ORAL | Status: AC
  Administered 2018-02-20: 200 mg via ORAL

## 2018-02-20 MED ORDER — SCOPOLAMINE 1 MG/3DAYS TD PT72
1.0000 | MEDICATED_PATCH | Freq: Once | TRANSDERMAL | Status: DC | PRN
  Administered 2018-02-20: 1.5 mg via TRANSDERMAL

## 2018-02-20 MED ORDER — KETOROLAC TROMETHAMINE 30 MG/ML IJ SOLN: 15.0000 mg | Freq: Once | INTRAMUSCULAR | Status: DC | PRN

## 2018-02-20 MED ORDER — ACETAMINOPHEN 500 MG PO TABS
1000.0000 mg | ORAL_TABLET | ORAL | Status: AC
  Administered 2018-02-20: 1000 mg via ORAL

## 2018-02-20 MED ORDER — CIPROFLOXACIN IN D5W 400 MG/200ML IV SOLN
400.0000 mg | INTRAVENOUS | Status: AC
  Administered 2018-02-20: 400 mg via INTRAVENOUS

## 2018-02-20 MED ORDER — PROPOFOL 10 MG/ML IV BOLUS
INTRAVENOUS | Status: DC | PRN
  Administered 2018-02-20: 150 mg via INTRAVENOUS

## 2018-02-20 MED ORDER — DEXAMETHASONE SODIUM PHOSPHATE 4 MG/ML IJ SOLN
INTRAMUSCULAR | Status: DC | PRN
  Administered 2018-02-20: 10 mg via INTRAVENOUS

## 2018-02-20 MED ORDER — LACTATED RINGERS IV SOLN
INTRAVENOUS | Status: DC
  Administered 2018-02-20 (×2): via INTRAVENOUS

## 2018-02-20 MED ORDER — ONDANSETRON HCL 4 MG/2ML IJ SOLN
INTRAMUSCULAR | Status: AC
Start: 2018-02-20 — End: ?
  Filled 2018-02-20: qty 12

## 2018-02-20 MED ORDER — FENTANYL CITRATE (PF) 100 MCG/2ML IJ SOLN
50.0000 ug | INTRAMUSCULAR | Status: AC | PRN
  Administered 2018-02-20: 25 ug via INTRAVENOUS
  Administered 2018-02-20: 50 ug via INTRAVENOUS
  Administered 2018-02-20 (×3): 25 ug via INTRAVENOUS

## 2018-02-20 MED ORDER — BUPIVACAINE-EPINEPHRINE (PF) 0.5% -1:200000 IJ SOLN
INTRAMUSCULAR | Status: AC
  Filled 2018-02-20: qty 30

## 2018-02-20 SURGICAL SUPPLY — 51 items
ADH SKN CLS APL DERMABOND .7 (GAUZE/BANDAGES/DRESSINGS) ×1
BINDER BREAST LRG (GAUZE/BANDAGES/DRESSINGS) IMPLANT
BINDER BREAST MEDIUM (GAUZE/BANDAGES/DRESSINGS) IMPLANT
BINDER BREAST XLRG (GAUZE/BANDAGES/DRESSINGS) IMPLANT
BINDER BREAST XXLRG (GAUZE/BANDAGES/DRESSINGS) IMPLANT
BLADE SURG 15 STRL LF DISP TIS (BLADE) ×1 IMPLANT
BLADE SURG 15 STRL SS (BLADE) ×3
CANISTER SUC SOCK COL 7IN (MISCELLANEOUS) IMPLANT
CANISTER SUCT 1200ML W/VALVE (MISCELLANEOUS) ×2 IMPLANT
CHLORAPREP W/TINT 26ML (MISCELLANEOUS) ×3 IMPLANT
CLIP VESOCCLUDE MED 6/CT (CLIP) ×2 IMPLANT
CLIP VESOCCLUDE SM WIDE 6/CT (CLIP) ×2 IMPLANT
COVER BACK TABLE 60X90IN (DRAPES) ×3 IMPLANT
COVER MAYO STAND STRL (DRAPES) ×3 IMPLANT
COVER PROBE W GEL 5X96 (DRAPES) ×3 IMPLANT
DECANTER SPIKE VIAL GLASS SM (MISCELLANEOUS) IMPLANT
DERMABOND ADVANCED (GAUZE/BANDAGES/DRESSINGS) ×2
DERMABOND ADVANCED .7 DNX12 (GAUZE/BANDAGES/DRESSINGS) ×1 IMPLANT
DEVICE DUBIN W/COMP PLATE 8390 (MISCELLANEOUS) ×3 IMPLANT
DRAPE LAPAROSCOPIC ABDOMINAL (DRAPES) ×3 IMPLANT
DRAPE UTILITY XL STRL (DRAPES) ×3 IMPLANT
ELECT COATED BLADE 2.86 ST (ELECTRODE) ×3 IMPLANT
ELECT REM PT RETURN 9FT ADLT (ELECTROSURGICAL) ×3
ELECTRODE REM PT RTRN 9FT ADLT (ELECTROSURGICAL) ×1 IMPLANT
GLOVE BIO SURGEON STRL SZ7 (GLOVE) ×2 IMPLANT
GLOVE BIOGEL PI IND STRL 8 (GLOVE) ×1 IMPLANT
GLOVE BIOGEL PI INDICATOR 8 (GLOVE) ×2
GLOVE ECLIPSE 7.5 STRL STRAW (GLOVE) ×3 IMPLANT
GOWN STRL REUS W/ TWL LRG LVL3 (GOWN DISPOSABLE) ×1 IMPLANT
GOWN STRL REUS W/ TWL XL LVL3 (GOWN DISPOSABLE) ×1 IMPLANT
GOWN STRL REUS W/TWL LRG LVL3 (GOWN DISPOSABLE)
GOWN STRL REUS W/TWL XL LVL3 (GOWN DISPOSABLE) ×6
ILLUMINATOR WAVEGUIDE N/F (MISCELLANEOUS) IMPLANT
KIT MARKER MARGIN INK (KITS) ×3 IMPLANT
NDL HYPO 25X1 1.5 SAFETY (NEEDLE) ×2 IMPLANT
NDL SAFETY ECLIPSE 18X1.5 (NEEDLE) ×1 IMPLANT
NEEDLE HYPO 18GX1.5 SHARP (NEEDLE)
NEEDLE HYPO 25X1 1.5 SAFETY (NEEDLE) ×3 IMPLANT
NS IRRIG 1000ML POUR BTL (IV SOLUTION) IMPLANT
PACK BASIN DAY SURGERY FS (CUSTOM PROCEDURE TRAY) ×3 IMPLANT
PENCIL BUTTON HOLSTER BLD 10FT (ELECTRODE) ×3 IMPLANT
SLEEVE SCD COMPRESS KNEE MED (MISCELLANEOUS) ×3 IMPLANT
SPONGE LAP 4X18 RFD (DISPOSABLE) ×1 IMPLANT
SUT MON AB 5-0 PS2 18 (SUTURE) ×5 IMPLANT
SUT VICRYL 3-0 CR8 SH (SUTURE) ×3 IMPLANT
SYR CONTROL 10ML LL (SYRINGE) ×4 IMPLANT
TOWEL OR 17X24 6PK STRL BLUE (TOWEL DISPOSABLE) ×3 IMPLANT
TOWEL OR NON WOVEN STRL DISP B (DISPOSABLE) ×3 IMPLANT
TUBE CONNECTING 20'X1/4 (TUBING)
TUBE CONNECTING 20X1/4 (TUBING) IMPLANT
YANKAUER SUCT BULB TIP NO VENT (SUCTIONS) IMPLANT

## 2018-02-20 NOTE — Anesthesia Procedure Notes (Signed)
Procedure Name: LMA Insertion Date/Time: 02/20/2018 9:49 AM Performed by: Signe Colt, CRNA Pre-anesthesia Checklist: Patient identified, Emergency Drugs available, Suction available and Patient being monitored Patient Re-evaluated:Patient Re-evaluated prior to induction Oxygen Delivery Method: Circle system utilized Preoxygenation: Pre-oxygenation with 100% oxygen Induction Type: IV induction Ventilation: Mask ventilation without difficulty LMA: LMA inserted LMA Size: 4.0 Number of attempts: 1 Airway Equipment and Method: Bite block Placement Confirmation: positive ETCO2 Tube secured with: Tape Dental Injury: Teeth and Oropharynx as per pre-operative assessment

## 2018-02-20 NOTE — Transfer of Care (Signed)
Immediate Anesthesia Transfer of Care Note  Patient: Margaret Hunter  Procedure(s) Performed: RIGHT BREAST LUMPECTOMY WITH RADIOACTIVE SEED AND RIGHT AXILLARY SENTINEL LYMPH NODE BIOPSY (Right Breast)  Patient Location: PACU  Anesthesia Type:GA combined with regional for post-op pain  Level of Consciousness: awake and patient cooperative  Airway & Oxygen Therapy: Patient Spontanous Breathing and Patient connected to face mask oxygen  Post-op Assessment: Report given to RN and Post -op Vital signs reviewed and stable  Post vital signs: Reviewed and stable  Last Vitals:  Vitals Value Taken Time  BP    Temp    Pulse    Resp    SpO2      Last Pain:  Vitals:   02/20/18 0825  TempSrc: Oral  PainSc: 0-No pain         Complications: No apparent anesthesia complications

## 2018-02-20 NOTE — Anesthesia Preprocedure Evaluation (Signed)
Anesthesia Evaluation  Patient identified by MRN, date of birth, ID band Patient awake    Reviewed: Allergy & Precautions, NPO status , Patient's Chart, lab work & pertinent test results  Airway Mallampati: II  TM Distance: >3 FB Neck ROM: Full    Dental no notable dental hx.    Pulmonary neg pulmonary ROS,    Pulmonary exam normal breath sounds clear to auscultation       Cardiovascular hypertension, Normal cardiovascular exam Rhythm:Regular Rate:Normal     Neuro/Psych negative neurological ROS  negative psych ROS   GI/Hepatic negative GI ROS, Neg liver ROS,   Endo/Other  negative endocrine ROS  Renal/GU negative Renal ROS  negative genitourinary   Musculoskeletal negative musculoskeletal ROS (+)   Abdominal   Peds negative pediatric ROS (+)  Hematology negative hematology ROS (+)   Anesthesia Other Findings   Reproductive/Obstetrics negative OB ROS                             Anesthesia Physical Anesthesia Plan  ASA: II  Anesthesia Plan: General   Post-op Pain Management:  Regional for Post-op pain   Induction: Intravenous  PONV Risk Score and Plan: 3 and Ondansetron, Dexamethasone, Treatment may vary due to age or medical condition and Scopolamine patch - Pre-op  Airway Management Planned: LMA  Additional Equipment:   Intra-op Plan:   Post-operative Plan: Extubation in OR  Informed Consent: I have reviewed the patients History and Physical, chart, labs and discussed the procedure including the risks, benefits and alternatives for the proposed anesthesia with the patient or authorized representative who has indicated his/her understanding and acceptance.   Dental advisory given  Plan Discussed with: CRNA and Surgeon  Anesthesia Plan Comments:         Anesthesia Quick Evaluation

## 2018-02-20 NOTE — Discharge Instructions (Signed)
[place] Office Phone Number [phone]  BREAST BIOPSY/ Lumpectomy: POST OP INSTRUCTIONS  Always review your discharge instruction sheet given to you by the facility where your surgery was performed.  IF YOU HAVE DISABILITY OR FAMILY LEAVE FORMS, YOU MUST BRING THEM TO THE OFFICE FOR PROCESSING.  DO NOT GIVE THEM TO YOUR DOCTOR.  1. A prescription for pain medication may be given to you upon discharge.  Take your pain medication as prescribed, if needed.  If narcotic pain medicine is not needed, then you may take acetaminophen (Tylenol) or ibuprofen (Advil) as needed. 2. Take your usually prescribed medications unless otherwise directed 3. If you need a refill on your pain medication, please contact your pharmacy.  They will contact our office to request authorization.  Prescriptions will not be filled after 5pm or on week-ends. 4. You should eat very light the first 24 hours after surgery, such as soup, crackers, pudding, etc.  Resume your normal diet the day after surgery. 5. Most patients will experience some swelling and bruising in the breast.  Ice packs and a good support bra will help.  Swelling and bruising can take several days to resolve.  6. It is common to experience some constipation if taking pain medication after surgery.  Increasing fluid intake and taking a stool softener will usually help or prevent this problem from occurring.  A mild laxative (Milk of Magnesia or Miralax) should be taken according to package directions if there are no bowel movements after 48 hours. 7. Unless discharge instructions indicate otherwise, you may remove your bandages 24-48 hours after surgery, and you may shower at that time.  You may have steri-strips (small skin tapes) in place directly over the incision.  These strips should be left on the skin for 7-10 days.  If your surgeon used skin glue on the incision, you may shower in 24 hours.  The glue will flake off over the next 2-3  weeks.  Any sutures or staples will be removed at the office during your follow-up visit. 8. ACTIVITIES:  You may resume regular daily activities (gradually increasing) beginning the next day.  Wearing a good support bra or sports bra minimizes pain and swelling.  You may have sexual intercourse when it is comfortable. a. You may drive when you no longer are taking prescription pain medication, you can comfortably wear a seatbelt, and you can safely maneuver your car and apply brakes. b. RETURN TO WORK:  ______________________________________________________________________________________ 9. You should see your doctor in the office for a follow-up appointment approximately two weeks after your surgery.  Your doctors nurse will typically make your follow-up appointment when she calls you with your pathology report.  Expect your pathology report 2-3 business days after your surgery.  You may call to check if you do not hear from [place] after three days. 10. OTHER INSTRUCTIONS: _______________________________________________________________________________________________ _____________________________________________________________________________________________________________________________________ _____________________________________________________________________________________________________________________________________ _____________________________________________________________________________________________________________________________________  WHEN TO CALL YOUR DOCTOR: 1. Fever over 101.0 2. Nausea and/or vomiting. 3. Extreme swelling or bruising. 4. Continued bleeding from incision. 5. Increased pain, redness, or drainage from the incision.  The clinic staff is available to answer your questions during regular business hours.  Please dont hesitate to call and ask to speak to one of the nurses for clinical concerns.  If you have a medical emergency, go to the nearest emergency  room or call 911.  A surgeon from [facility] is always on call at the hospital.  For further questions, please visit [email or web address]  Post Anesthesia Home Care Instructions ° °Activity: °Get plenty of rest for the remainder of the day. A responsible individual must stay with you for 24 hours following the procedure.  °For the next 24 hours, DO NOT: °-Drive a car °-Operate machinery °-Drink alcoholic beverages °-Take any medication unless instructed by your physician °-Make any legal decisions or sign important papers. ° °Meals: °Start with liquid foods such as gelatin or soup. Progress to regular foods as tolerated. Avoid greasy, spicy, heavy foods. If nausea and/or vomiting occur, drink only clear liquids until the nausea and/or vomiting subsides. Call your physician if vomiting continues. ° °Special Instructions/Symptoms: °Your throat may feel dry or sore from the anesthesia or the breathing tube placed in your throat during surgery. If this causes discomfort, gargle with warm salt water. The discomfort should disappear within 24 hours. ° °If you had a scopolamine patch placed behind your ear for the management of post- operative nausea and/or vomiting: ° °1. The medication in the patch is effective for 72 hours, after which it should be removed.  Wrap patch in a tissue and discard in the trash. Wash hands thoroughly with soap and water. °2. You may remove the patch earlier than 72 hours if you experience unpleasant side effects which may include dry mouth, dizziness or visual disturbances. °3. Avoid touching the patch. Wash your hands with soap and water after contact with the patch. °  ° °

## 2018-02-20 NOTE — Op Note (Signed)
Preoperative Diagnosis: cancer right breast  Postoprative Diagnosis: cancer right breast  Procedure: Procedure(s): RIGHT BREAST LUMPECTOMY WITH RADIOACTIVE SEED AND RIGHT AXILLARY SENTINEL LYMPH NODE BIOPSY   Surgeon: Excell Seltzer T   Assistants: None  Anesthesia:  General LMA anesthesia  Indications:  72 year old female with a new diagnosis of cancer of the ight breast, upper inner quadrant. Clinical stage one a,ER/PR positive, HER-2 negative.  Primary tumor estimated at 5 mm.  After preoperative work-up and discussion detailed elsewhere we have elected to proceed with radioactive seed localized right breast lumpectomy and axillary sentinel lymph node biopsy as initial surgical therapy.   Procedure Detail: Patient had previously undergone placement of a radioactive seed at the clip site.  In the holding area she underwent a pectoral block and injection of 1 mCi of technetium sulfur colloid intradermally around the right nipple.  The seed was confirmed with the neoprobe.  She was taken to the operating room, placed in supine position on the operating table, and laryngeal mask general anesthesia induced.  She was carefully positioned with her right arm extended and the entire right breast and axilla and upper arm were widely sterilely prepped and draped.  Patient timeout was performed and correct procedure verified.  The lumpectomy was approached initially.  The seed was located in the upper medial breast.  This was quite a ways from the nipple and I elected to use a transverse incision in a skin crease overlying the seed.  Dissection was carried down through the simultaneous tissue.  As the breast capsule was approached the neoprobe was used to localize the seed and a moderate specimen of breast tissue was excised around the area of high counts.  The seed was confirmed in the specimen with the neoprobe lying somewhat close to the lateral margin.  The specimen was inked and specimen x-ray  confirmed the seton marking clip in the specimen again close to the lateral margin.  An additional approximately 1 cm lateral margin was then obtained with cautery and oriented and sent as a separate specimen.  The lumpectomy cavity was marked with clips after assuring complete hemostasis.  The deep breast and subtenons tissue was closed with interrupted 3-0 Vicryl. Attention was turned to the sentinel node.  An area of elevated counts was identified in the right axilla with the neoprobe and a transverse incision made in a skin crease.  Dissection was carried down through the subcutaneous tissue and deepened into the axilla.  Using the neoprobe for guidance I dissected down onto a slightly enlarged but otherwise normal appearing lymph node with high counts which was excised with cautery and ex vivo had counts of about 350.  Using the neoprobe and exploring the axilla I did find another smaller node with elevated counts which was isolated and excised and had counts of about 350.  There was no palpable adenopathy.  Background of the axilla at this point was essentially 0.  These were sent as axillary sentinel lymph nodes.  Complete hemostasis was obtained.  The deep axillary and subtenons tissue was closed with interrupted 3-0 Vicryl.  Both skin incisions were closed with running subcuticular 5-0 Monocryl and Dermabond.  Sponge needle and instrument counts were correct.    Findings: As above  Estimated Blood Loss:  Minimal         Drains: None  Blood Given: none          Specimens: #1 right breast lumpectomy #2 further lateral margin   #3 right  axillary sentinel lymph nodes X 2        Complications:  * No complications entered in OR log *         Disposition: PACU - hemodynamically stable.         Condition: stable

## 2018-02-20 NOTE — Anesthesia Postprocedure Evaluation (Signed)
Anesthesia Post Note  Patient: Margaret Hunter  Procedure(s) Performed: RIGHT BREAST LUMPECTOMY WITH RADIOACTIVE SEED AND RIGHT AXILLARY SENTINEL LYMPH NODE BIOPSY (Right Breast)     Patient location during evaluation: PACU Anesthesia Type: General Level of consciousness: awake and alert Pain management: pain level controlled Vital Signs Assessment: post-procedure vital signs reviewed and stable Respiratory status: spontaneous breathing, nonlabored ventilation, respiratory function stable and patient connected to nasal cannula oxygen Cardiovascular status: blood pressure returned to baseline and stable Postop Assessment: no apparent nausea or vomiting Anesthetic complications: no    Last Vitals:  Vitals:   02/20/18 1200 02/20/18 1230  BP: 125/62 (!) 125/59  Pulse: 64 68  Resp: 14 16  Temp:  36.6 C  SpO2: 94% 95%    Last Pain:  Vitals:   02/20/18 1230  TempSrc:   PainSc: 3                  Antanisha Mohs S

## 2018-02-20 NOTE — Interval H&P Note (Signed)
History and Physical Interval Note:  02/20/2018 9:38 AM  Margaret Hunter  has presented today for surgery, with the diagnosis of Cancer right breast  The various methods of treatment have been discussed with the patient and family. After consideration of risks, benefits and other options for treatment, the patient has consented to  Procedure(s): RIGHT BREAST LUMPECTOMY WITH RADIOACTIVE SEED AND RIGHT AXILLARY SENTINEL LYMPH NODE BIOPSY ERAS PATHWAY (Right) as a surgical intervention .  The patient's history has been reviewed, patient examined, no change in status, stable for surgery.  I have reviewed the patient's chart and labs.  Questions were answered to the patient's satisfaction.     Darene Lamer Graeson [place]

## 2018-02-20 NOTE — Anesthesia Procedure Notes (Signed)
Anesthesia Procedure Image    

## 2018-02-20 NOTE — Anesthesia Procedure Notes (Signed)
Anesthesia Regional Block: Pectoralis block   Pre-Anesthetic Checklist: ,, timeout performed, Correct Patient, Correct Site, Correct Laterality, Correct Procedure, Correct Position, site marked, Risks and benefits discussed,  Surgical consent,  Pre-op evaluation,  At surgeon's request and post-op pain management  Laterality: Right  Prep: chloraprep       Needles:  Injection technique: Single-shot  Needle Type: Echogenic Needle     Needle Length: 9cm      Additional Needles:   Procedures:,,,, ultrasound used (permanent image in chart),,,,  Narrative:  Start time: 02/20/2018 9:05 AM End time: 02/20/2018 9:15 AM Injection made incrementally with aspirations every 5 mL.  Performed by: Personally  Anesthesiologist: Myrtie Soman, MD  Additional Notes: Patient tolerated the procedure well without complications

## 2018-02-24 ENCOUNTER — Encounter (HOSPITAL_BASED_OUTPATIENT_CLINIC_OR_DEPARTMENT_OTHER): Payer: Self-pay | Admitting: General Surgery

## 2018-02-27 ENCOUNTER — Telehealth: Payer: Self-pay | Admitting: Hematology and Oncology

## 2018-02-27 ENCOUNTER — Inpatient Hospital Stay (HOSPITAL_BASED_OUTPATIENT_CLINIC_OR_DEPARTMENT_OTHER): Payer: Medicare Other | Admitting: Hematology and Oncology

## 2018-02-27 DIAGNOSIS — Z79899 Other long term (current) drug therapy: Secondary | ICD-10-CM | POA: Diagnosis not present

## 2018-02-27 DIAGNOSIS — Z170 Estrogen receptor positive status [ER+]: Secondary | ICD-10-CM

## 2018-02-27 DIAGNOSIS — C50211 Malignant neoplasm of upper-inner quadrant of right female breast: Secondary | ICD-10-CM | POA: Diagnosis not present

## 2018-02-27 DIAGNOSIS — Z79811 Long term (current) use of aromatase inhibitors: Secondary | ICD-10-CM | POA: Diagnosis not present

## 2018-02-27 DIAGNOSIS — D0511 Intraductal carcinoma in situ of right breast: Secondary | ICD-10-CM | POA: Diagnosis not present

## 2018-02-27 MED ORDER — LETROZOLE 2.5 MG PO TABS: 2.5000 mg | ORAL_TABLET | Freq: Every day | ORAL | 3 refills | Status: DC

## 2018-02-27 MED ORDER — BLACK PEPPER-TURMERIC 3-500 MG PO CAPS: 500.0000 mg | ORAL_CAPSULE | Freq: Two times a day (BID) | ORAL | Status: DC

## 2018-02-27 NOTE — Progress Notes (Signed)
Patient Care Team: Harlan Stains, MD as PCP - General (Family Medicine) Excell Seltzer, MD as Consulting Physician (General Surgery) Nicholas Lose, MD as Consulting Physician (Hematology and Oncology) Gery Pray, MD as Consulting Physician (Radiation Oncology)  DIAGNOSIS:  Encounter Diagnosis  Name Primary?  . Malignant neoplasm of upper-inner quadrant of right breast in female, estrogen receptor positive ([place])     SUMMARY OF ONCOLOGIC HISTORY:   Malignant neoplasm of upper-inner quadrant of right breast in female, estrogen receptor positive ([place])   01/30/2018 Initial Diagnosis    Screening detected right breast mass 0.5 cm at 1 o'clock position 9 cm from nipple, axilla negative biopsy: Invasive ductal carcinoma and a papillary lesion grade 1-2, ER PR positive HER-2 negative Ki-67 20%, T1 a N0 stage I a clinical stage AJCC 8      02/13/2018 Genetic Testing    The Common Hereditary Cancers Panel + Myelodysplastic Syndrome/Leukemia Panel was ordered (54 genes) The following genes were evaluated for sequence changes and exonic deletions/duplications: APC, ATM, AXIN2, BARD1, BLM, BMPR1A, BRCA1, BRCA2, BRIP1, CDH1, CDK4, CDKN2A (p14ARF), CDKN2A (p16INK4a), CEBPA, CHEK2, CTNNA1, DICER1, EPCAM*, GATA2, GREM1*, HRAS, KIT, MEN1, MLH1, MSH2, MSH3, MSH6, MUTYH, NBN, NF1, PALB2, PDGFRA, PMS2, POLD1, POLE, PTEN, RAD50, RAD51C, RAD51D, RUNX1, SDHB, SDHC, SDHD, SMAD4, SMARCA4, STK11, TERC, TERT, TP53, TSC1, TSC2, VHL The following genes were evaluated for sequence changes only:HOXB13*, NTHL1*, SDHA  Results: Negative, no pathogenic variants identified.  The date of this test report is 02/13/2018.       02/20/2018 Surgery    Right lumpectomy: IDC with DCIS, 0.6 cm, grade 1, 0/2 lymph nodes negative, T1 b N0 stage Ia, ER 100%, PR 95%, HER-2 negative ratio 1.24       CHIEF COMPLIANT: Follow-up after right lumpectomy  INTERVAL HISTORY: Margaret Hunter is a 72 year old with above-mentioned history  of right breast cancer who underwent lumpectomy and is here today to discuss the results.  She is healing and recovering very well from surgery.  She has mild discomfort underneath the arm.  REVIEW OF SYSTEMS:   Constitutional: Denies fevers, chills or abnormal weight loss Eyes: Denies blurriness of vision Ears, nose, mouth, throat, and face: Denies mucositis or sore throat Respiratory: Denies cough, dyspnea or wheezes Cardiovascular: Denies palpitation, chest discomfort Gastrointestinal:  Denies nausea, heartburn or change in bowel habits Skin: Denies abnormal skin rashes Lymphatics: Denies new lymphadenopathy or easy bruising Neurological:Denies numbness, tingling or new weaknesses Behavioral/Psych: Mood is stable, no new changes  Extremities: No lower extremity edema Breast: Recent right lumpectomy All other systems were reviewed with the patient and are negative.  I have reviewed the past medical history, past surgical history, social history and family history with the patient and they are unchanged from previous note.  ALLERGIES:  is allergic to penicillin g.  MEDICATIONS:  Current Outpatient Medications  Medication Sig Dispense Refill  . Black Pepper-Turmeric 3-500 MG CAPS Take 500 mg by mouth 2 (two) times daily.    . Calcium Acetate, Phos Binder, (CALCIUM ACETATE PO) Take by mouth.    . cholecalciferol (VITAMIN D) 1000 units tablet Take 2,000 Units by mouth daily.    Marland Kitchen letrozole (FEMARA) 2.5 MG tablet Take 1 tablet (2.5 mg total) by mouth daily. 90 tablet 3  . losartan-hydrochlorothiazide (HYZAAR) 100-25 MG tablet Take by mouth.    . Multiple Vitamin (MULTIVITAMIN) tablet Take 1 tablet by mouth daily.    . simvastatin (ZOCOR) 40 MG tablet   2  . traMADol (ULTRAM) 50 MG  tablet Take 1 tablet (50 mg total) by mouth every 6 (six) hours as needed. 15 tablet 0   No current facility-administered medications for this visit.     PHYSICAL EXAMINATION: ECOG PERFORMANCE STATUS: 1 -  Symptomatic but completely ambulatory  Vitals:   02/27/18 1020  BP: 138/70  Pulse: 74  Resp: 16  Temp: 98 F (36.7 C)  SpO2: 97%   Filed Weights   02/27/18 1020  Weight: 199 lb 11.2 oz (90.6 kg)    GENERAL:alert, no distress and comfortable SKIN: skin color, texture, turgor are normal, no rashes or significant lesions EYES: normal, Conjunctiva are pink and non-injected, sclera clear OROPHARYNX:no exudate, no erythema and lips, buccal mucosa, and tongue normal  NECK: supple, thyroid normal size, non-tender, without nodularity LYMPH:  no palpable lymphadenopathy in the cervical, axillary or inguinal LUNGS: clear to auscultation and percussion with normal breathing effort HEART: regular rate & rhythm and no murmurs and no lower extremity edema ABDOMEN:abdomen soft, non-tender and normal bowel sounds MUSCULOSKELETAL:no cyanosis of digits and no clubbing  NEURO: alert & oriented x 3 with fluent speech, no focal motor/sensory deficits EXTREMITIES: No lower extremity edema   LABORATORY DATA:  I have reviewed the data as listed CMP Latest Ref Rng & Units 02/04/2018  Glucose 70 - 140 mg/dL 103  BUN 7 - 26 mg/dL 19  Creatinine 0.60 - 1.10 mg/dL 0.77  Sodium 136 - 145 mmol/L 141  Potassium 3.5 - 5.1 mmol/L 3.8  Chloride 98 - 109 mmol/L 106  CO2 22 - 29 mmol/L 28  Calcium 8.4 - 10.4 mg/dL 10.7(H)  Total Protein 6.4 - 8.3 g/dL 7.0  Total Bilirubin 0.2 - 1.2 mg/dL 0.5  Alkaline Phos 40 - 150 U/L 101  AST 5 - 34 U/L 14  ALT 0 - 55 U/L 16    Lab Results  Component Value Date   WBC 8.2 02/04/2018   HGB 13.2 02/04/2018   HCT 40.4 02/04/2018   MCV 86.8 02/04/2018   PLT 252 02/04/2018   NEUTROABS 5.7 02/04/2018    ASSESSMENT & PLAN:  Malignant neoplasm of upper-inner quadrant of right breast in female, estrogen receptor positive ([place]) 02/20/2018:Right lumpectomy: IDC with DCIS, 0.6 cm, grade 1, 0/2 lymph nodes negative, T1 b N0 stage Ia, ER 100%, PR 95%, HER-2 negative ratio  1.24 Genetic testing negative  Pathology counseling: I discussed the final pathology report of the patient provided  a copy of this report. I discussed the margins as well as lymph node surgeries. We also discussed the final staging along with previously performed ER/PR and HER-2/neu testing.  Treatment plan: 1. Adjuvant radiation: Patient previously discussed the pros and cons of adjuvant radiation and decided that she does not want to pursue radiation because her tumor is very favorable and she is over the age of 39. 2. Adjuvant antiestrogen therapy  with letrozole 2.5 mg daily x5 to 7 years. I sent her a prescription for letrozole.    Letrozole counseling: We discussed the risks and benefits of anti-estrogen therapy with aromatase inhibitors. These include but not limited to insomnia, hot flashes, mood changes, vaginal dryness, bone density loss, and weight gain. We strongly believe that the benefits far outweigh the risks. Patient understands these risks and consented to starting treatment. Planned treatment duration is 5 years.  Return to clinic in 3 months for toxicity check and follow-up  No orders of the defined types were placed in this encounter.  The patient has a good understanding of the  overall plan. she agrees with it. she will call with any problems that may develop before the next visit here.   Harriette Ohara, MD 02/27/18

## 2018-02-27 NOTE — Assessment & Plan Note (Signed)
02/20/2018:Right lumpectomy: IDC with DCIS, 0.6 cm, grade 1, 0/2 lymph nodes negative, T1 b N0 stage Ia, ER 100%, PR 95%, HER-2 negative ratio 1.24 Genetic testing negative  Pathology counseling: I discussed the final pathology report of the patient provided  a copy of this report. I discussed the margins as well as lymph node surgeries. We also discussed the final staging along with previously performed ER/PR and HER-2/neu testing.  Treatment plan: 1. Adjuvant radiation  2. Adjuvant antiestrogen therapy   Return to clinic at the end of radiation

## 2018-02-27 NOTE — Telephone Encounter (Signed)
Gave patient AVs and calendar of upcoming august appointments.  °

## 2018-03-04 ENCOUNTER — Encounter: Payer: Self-pay | Admitting: *Deleted

## 2018-03-10 ENCOUNTER — Other Ambulatory Visit: Payer: Self-pay | Admitting: General Surgery

## 2018-03-10 DIAGNOSIS — R1011 Right upper quadrant pain: Secondary | ICD-10-CM

## 2018-03-12 ENCOUNTER — Ambulatory Visit: Payer: Medicare Other

## 2018-03-12 ENCOUNTER — Ambulatory Visit
Admission: RE | Admit: 2018-03-12 | Discharge: 2018-03-12 | Disposition: A | Discharge status: Home / Self Care | Payer: Medicare Other | Source: Ambulatory Visit | Attending: Radiation Oncology | Admitting: Radiation Oncology

## 2018-03-20 ENCOUNTER — Ambulatory Visit
Admission: RE | Admit: 2018-03-20 | Discharge: 2018-03-20 | Disposition: A | Discharge status: Home / Self Care | Payer: Medicare Other | Source: Ambulatory Visit | Attending: General Surgery | Admitting: General Surgery

## 2018-03-20 DIAGNOSIS — K7689 Other specified diseases of liver: Secondary | ICD-10-CM | POA: Diagnosis not present

## 2018-03-20 DIAGNOSIS — R1011 Right upper quadrant pain: Secondary | ICD-10-CM

## 2018-03-30 ENCOUNTER — Ambulatory Visit: Payer: Medicare Other | Attending: General Surgery | Admitting: Physical Therapy

## 2018-03-30 ENCOUNTER — Encounter: Payer: Self-pay | Admitting: Physical Therapy

## 2018-03-30 ENCOUNTER — Other Ambulatory Visit: Payer: Self-pay

## 2018-03-30 DIAGNOSIS — R293 Abnormal posture: Secondary | ICD-10-CM | POA: Diagnosis not present

## 2018-03-30 DIAGNOSIS — Z483 Aftercare following surgery for neoplasm: Secondary | ICD-10-CM | POA: Insufficient documentation

## 2018-03-30 DIAGNOSIS — M25611 Stiffness of right shoulder, not elsewhere classified: Secondary | ICD-10-CM | POA: Diagnosis not present

## 2018-03-30 DIAGNOSIS — G8929 Other chronic pain: Secondary | ICD-10-CM | POA: Diagnosis not present

## 2018-03-30 DIAGNOSIS — M25511 Pain in right shoulder: Secondary | ICD-10-CM | POA: Insufficient documentation

## 2018-03-30 DIAGNOSIS — C50211 Malignant neoplasm of upper-inner quadrant of right female breast: Secondary | ICD-10-CM | POA: Diagnosis not present

## 2018-03-30 DIAGNOSIS — Z170 Estrogen receptor positive status [ER+]: Secondary | ICD-10-CM | POA: Insufficient documentation

## 2018-03-30 NOTE — Therapy (Signed)
[place], [place], [address] Phone: [phone]   Fax:  [phone]  Physical Therapy Treatment  Patient Details  Name: Margaret Hunter MRN: [id number] Date of Birth: 1946-02-23 Referring Provider: Dr. Excell Seltzer   Encounter Date: 03/30/2018  PT End of Session - 03/30/18 1101    Visit Number  5    Number of Visits  8    PT Start Time  1010    PT Stop Time  1100    PT Time Calculation (min)  50 min    Activity Tolerance  Patient tolerated treatment well    Behavior During Therapy  [facility] for tasks assessed/performed       Past Medical History:  Diagnosis Date  . Cancer ([place]) 01/2018   right breast cancer  . Family history of ovarian cancer   . Hypertension     Past Surgical History:  Procedure Laterality Date  . BREAST LUMPECTOMY WITH RADIOACTIVE SEED AND SENTINEL LYMPH NODE BIOPSY Right 02/20/2018   Procedure: RIGHT BREAST LUMPECTOMY WITH RADIOACTIVE SEED AND RIGHT AXILLARY SENTINEL LYMPH NODE BIOPSY;  Surgeon: Excell Seltzer, MD;  Location: [facility];  Service: General;  Laterality: Right;  . CESAREAN SECTION    . HERNIA REPAIR Right    IHR    There were no vitals filed for this visit.  Subjective Assessment - 03/30/18 1017    Subjective  Patient underwent a right lumpectomy and sentinel node biopsy (0/2 nodes) on 02/20/18. She will not undergo radiation as her mass was 6 mm and will not need chemotherapy. She is taking anti-estrogen. She is scheduling surgery to have her gallbladder removed.    Pertinent History  Patient underwent a right lumpectomy and sentinel node biopsy (0/2 nodes) on 02/20/18. She needs to have her gallbladder removed.    Patient Stated Goals  Learn about lymphedema risk reduction and make sure my arm is ok    Currently in Pain?  No/denies         [facility] PT Assessment - 03/30/18 0001      Assessment   Medical Diagnosis  s/p right lumpectomy  and sentinel node biopsy    Referring Provider  Dr. Excell Seltzer    Onset Date/Surgical Date  02/20/18    Hand Dominance  Right    Prior Therapy  Baseline assessment      Precautions   Precautions  Other (comment)    Precaution Comments  right arm lymphedema risk      Restrictions   Weight Bearing Restrictions  No      Balance Screen   Has the patient fallen in the past 6 months  No    Has the patient had a decrease in activity level because of a fear of falling?   No    Is the patient reluctant to leave their home because of a fear of falling?   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      AROM   AROM Assessment Site  Shoulder    Right/Left Shoulder  Right    Right Shoulder Extension  42 Degrees    Right Shoulder Flexion  131 Degrees    Right Shoulder ABduction  155 Degrees    Right Shoulder Internal Rotation  50 Degrees    Right Shoulder External Rotation  75 Degrees        LYMPHEDEMA/ONCOLOGY QUESTIONNAIRE - 03/30/18 1024      Type   Cancer Type  s/p right lumpectomy and SLNB      Surgeries   Lumpectomy Date  02/20/18    Sentinel Lymph Node Biopsy Date  02/20/18    Number Lymph Nodes Removed  2      Treatment   Active Chemotherapy Treatment  No    Past Chemotherapy Treatment  No    Active Radiation Treatment  No    Past Radiation Treatment  No    Current  Hormone Treatment  Yes    Date  02/27/18    Drug Name  Letrozole    Past Hormone Therapy  No      What other symptoms do you have   Are you Having Heaviness or Tightness  No    Are you having Pain  No    Are you having pitting edema  No    Is it Hard or Difficult finding clothes that fit  No    Do you have infections  No    Is there Decreased scar mobility  Yes    Stemmer Sign  No      Right Upper Extremity Lymphedema   10 cm Proximal to Olecranon Process  33.3 cm    Olecranon Process  25.5 cm    10 cm Proximal to Ulnar Styloid Process  22.2 cm    Just Proximal to Ulnar Styloid Process  15 cm    Across Hand at [employer]  18.3 cm    At [place] of 2nd Digit  6 cm      Left Upper Extremity Lymphedema   10 cm Proximal to Olecranon Process  33.3 cm    Olecranon Process  25.3 cm    10 cm Proximal to Ulnar Styloid Process  22.6 cm    Just Proximal to Ulnar Styloid Process  14.8 cm    Across Hand at [employer]  18.6 cm    At [place] of 2nd Digit  6.1 cm        Quick Dash - 03/30/18 0001    Open a tight or new jar  No difficulty    Do heavy household chores (wash walls, wash floors)  No difficulty    Carry a shopping bag or briefcase  No difficulty    Wash your back  No difficulty    Use a knife to cut food  No difficulty    Recreational activities in which you take some force or impact through your arm, shoulder, or hand (golf, hammering, tennis)  No difficulty    During the past week, to what extent has your arm, shoulder or hand problem interfered with your normal social activities with family, friends, neighbors, or groups?  Not at all    During the past week, to what extent has  your arm, shoulder or hand problem limited your work or other regular daily activities  Not at all    Arm, shoulder, or hand pain.  Mild    Tingling (pins and needles) in your arm, shoulder, or hand  None    Difficulty Sleeping  No difficulty    DASH Score  2.27 %                      PT Education - 03/30/18 1100    Education provided  Yes    Education Details  Lymphedema risk reduction (issued ABC class packet) and scar massage    Person(s) Educated  Patient    Methods  Explanation;Demonstration;Handout    Comprehension  Returned demonstration;Verbalized understanding          PT Long Term Goals - 03/30/18 1103      PT LONG TERM GOAL #1   Title  Patient will demonstrate she has returned to baseline related to shoulder function and ROM post operatively.    Time  8    Period  Weeks    Status  Achieved      PT LONG TERM GOAL #2   Title  Patient will increase right shoulder active internal rotation to >/= 50 degrees for increased function.    Time  4    Period  Weeks    Status  Achieved      PT LONG TERM GOAL #3   Title  Patient will increase right shoulder flexion to >/= 140 degrees for increased ease reaching overhead.    Time  4    Period  Weeks    Status  Not Met      PT LONG TERM GOAL #4   Title  Patient will report >/= 50% reduction in right shoulder pain.    Time  4    Period  Weeks    Status  Achieved      Breast Clinic Goals - 02/04/18 1204      Patient will be able to verbalize understanding of pertinent lymphedema risk reduction practices relevant to her diagnosis specifically related to skin care.   Time  1    Period  Days    Status  Achieved      Patient will be able to return demonstrate and/or verbalize understanding of the post-op home exercise program related to regaining shoulder range of motion.   Time  1    Period  Days    Status  Achieved      Patient will be able to verbalize understanding of the importance of attending the postoperative After Breast Cancer Class for further lymphedema risk reduction education and therapeutic exercise.   Time  1    Period  Days    Status  Achieved           Plan - 03/30/18 1101    Clinical Impression Statement  Patient is doing very well s/p right  lumpectomy and sentinel node biopsy. Her only isse is related to scar tissue as it feels thick and she reports at times is uncomfortable. Her shoulder ROM is better than at baseline as she has worked on improving her shoulder mobility. She had many questions realted to lymphedema prevention and those were addressed. She has no other PT needs at this time.    PT Treatment/Interventions  ADLs/Self Care Home Management;Therapeutic exercise;Patient/family education    PT Next Visit Plan  D/C - no further needs for PT  Consulted and Agree with Plan of Care  Patient       Patient will benefit from skilled therapeutic intervention in order to improve the following deficits and impairments:  Decreased knowledge of precautions, Impaired UE functional use, Decreased strength, Decreased range of motion, Postural dysfunction, Pain  Visit Diagnosis: Abnormal posture  Chronic right shoulder pain  Stiffness of right shoulder, not elsewhere classified  Malignant neoplasm of upper-inner quadrant of right breast in female, estrogen receptor positive [facility])  Aftercare following surgery for neoplasm     Problem List Patient Active Problem List   Diagnosis Date Noted  . Genetic testing 02/17/2018  . Family history of ovarian cancer   . Malignant neoplasm of upper-inner quadrant of right breast in female, estrogen receptor positive ([place]) 01/30/2018    Margaret Hunter,Margaret Hunter 03/30/2018, 11:05 AM  [place] [place], [place], [address] Phone: [phone]   Fax:  [phone]  Name: Margaret Hunter MRN: [id number] Date of Birth: 03-21-1946  PHYSICAL THERAPY DISCHARGE SUMMARY  Visits from Start of Care: 5  Current functional level related to goals / functional outcomes: Patient met all goals except shoulder flexion ROM which is still somewhat limited but is better than baseline and she reports it does not limit her functionally.    Remaining deficits: See above.   Education / Equipment: Lymphedema risk reduction. Plan: Patient agrees to discharge.  Patient goals were partially met. Patient is being discharged due to being pleased with the current functional level.  ?????         Margaret Hunter, [place] 03/30/18 11:06 AM

## 2018-04-03 DIAGNOSIS — N952 Postmenopausal atrophic vaginitis: Secondary | ICD-10-CM | POA: Diagnosis not present

## 2018-04-03 DIAGNOSIS — Z1211 Encounter for screening for malignant neoplasm of colon: Secondary | ICD-10-CM | POA: Diagnosis not present

## 2018-04-03 DIAGNOSIS — C50311 Malignant neoplasm of lower-inner quadrant of right female breast: Secondary | ICD-10-CM | POA: Diagnosis not present

## 2018-04-03 DIAGNOSIS — E559 Vitamin D deficiency, unspecified: Secondary | ICD-10-CM | POA: Diagnosis not present

## 2018-04-03 DIAGNOSIS — Z170 Estrogen receptor positive status [ER+]: Secondary | ICD-10-CM | POA: Diagnosis not present

## 2018-04-03 DIAGNOSIS — R7303 Prediabetes: Secondary | ICD-10-CM | POA: Diagnosis not present

## 2018-04-03 DIAGNOSIS — E785 Hyperlipidemia, unspecified: Secondary | ICD-10-CM | POA: Diagnosis not present

## 2018-04-03 DIAGNOSIS — Z0000 Encounter for general adult medical examination without abnormal findings: Secondary | ICD-10-CM | POA: Diagnosis not present

## 2018-04-03 DIAGNOSIS — I10 Essential (primary) hypertension: Secondary | ICD-10-CM | POA: Diagnosis not present

## 2018-04-03 DIAGNOSIS — G43909 Migraine, unspecified, not intractable, without status migrainosus: Secondary | ICD-10-CM | POA: Diagnosis not present

## 2018-04-30 ENCOUNTER — Encounter (HOSPITAL_BASED_OUTPATIENT_CLINIC_OR_DEPARTMENT_OTHER): Payer: Self-pay | Admitting: *Deleted

## 2018-04-30 ENCOUNTER — Other Ambulatory Visit: Payer: Self-pay

## 2018-05-01 ENCOUNTER — Encounter (HOSPITAL_BASED_OUTPATIENT_CLINIC_OR_DEPARTMENT_OTHER)
Admission: RE | Admit: 2018-05-01 | Discharge: 2018-05-01 | Disposition: A | Discharge status: Home / Self Care | Payer: Medicare Other | Source: Ambulatory Visit | Attending: General Surgery | Admitting: General Surgery

## 2018-05-01 DIAGNOSIS — Z01812 Encounter for preprocedural laboratory examination: Secondary | ICD-10-CM | POA: Diagnosis not present

## 2018-05-01 LAB — BASIC METABOLIC PANEL
Anion gap: 9 (ref 5–15)
BUN: 20 mg/dL (ref 8–23)
CO2: 27 mmol/L (ref 22–32)
Calcium: 10.4 mg/dL — ABNORMAL HIGH (ref 8.9–10.3)
Chloride: 104 mmol/L (ref 98–111)
Creatinine, Ser: 0.66 mg/dL (ref 0.44–1.00)
GFR calc non Af Amer: >60 mL/min (ref >60)
GLUCOSE: 118 mg/dL — AB (ref 70–99)
POTASSIUM: 3.7 mmol/L (ref 3.5–5.1)
SODIUM: 140 mmol/L (ref 135–145)

## 2018-05-07 ENCOUNTER — Encounter (HOSPITAL_COMMUNITY): Payer: Self-pay | Admitting: *Deleted

## 2018-05-07 ENCOUNTER — Ambulatory Visit: Payer: Self-pay | Admitting: General Surgery

## 2018-05-07 ENCOUNTER — Other Ambulatory Visit: Payer: Self-pay

## 2018-05-07 NOTE — Progress Notes (Signed)
Pt denies SOB, chest pain, and being under the care of a cardiologist. PT denies having a stress test, echo and cardiac cath. Pt denies having a chest x ray within the last year. Pt made aware to stop taking Aspirin, vitamins, fish oil, Turmeric and herbal medications. Do not take any NSAIDs ie: Ibuprofen, Advil, Naproxen (Aleve), Motrin, BC and Goody Powder or any medication containing Aspirin. Pt verbalized understanding of all pre-op instructions.

## 2018-05-07 NOTE — Anesthesia Preprocedure Evaluation (Addendum)
Anesthesia Evaluation  Patient identified by MRN, date of birth, ID band Patient awake    Reviewed: Allergy & Precautions, NPO status , Patient's Chart, lab work & pertinent test results  History of Anesthesia Complications Negative for: history of anesthetic complications  Airway Mallampati: II  TM Distance: >3 FB Neck ROM: Full    Dental  (+) Dental Advisory Given, Teeth Intact   Pulmonary neg pulmonary ROS,    breath sounds clear to auscultation       Cardiovascular Exercise Tolerance: Good hypertension, Pt. on medications (-) angina Rhythm:Regular Rate:Normal     Neuro/Psych negative neurological ROS  negative psych ROS   GI/Hepatic Neg liver ROS,  Cholelithiasis   Endo/Other   Obesity   Renal/GU negative Renal ROS  negative genitourinary   Musculoskeletal negative musculoskeletal ROS (+)   Abdominal (+) + obese,   Peds  Hematology negative hematology ROS (+)   Anesthesia Other Findings   Reproductive/Obstetrics  Hx right breast cancer                            Anesthesia Physical Anesthesia Plan  ASA: II  Anesthesia Plan: General   Post-op Pain Management:    Induction: Intravenous  PONV Risk Score and Plan: 4 or greater and Treatment may vary due to age or medical condition, Ondansetron and Dexamethasone  Airway Management Planned: Oral ETT  Additional Equipment: None  Intra-op Plan:   Post-operative Plan: Extubation in OR  Informed Consent: I have reviewed the patients History and Physical, chart, labs and discussed the procedure including the risks, benefits and alternatives for the proposed anesthesia with the patient or authorized representative who has indicated his/her understanding and acceptance.   Dental advisory given  Plan Discussed with: CRNA and Anesthesiologist  Anesthesia Plan Comments:        Anesthesia Quick Evaluation

## 2018-05-08 ENCOUNTER — Ambulatory Visit (HOSPITAL_COMMUNITY)
Admission: RE | Admit: 2018-05-08 | Discharge: 2018-05-08 | Disposition: A | Discharge status: Home / Self Care | Payer: Medicare Other | Source: Ambulatory Visit | Attending: General Surgery | Admitting: General Surgery

## 2018-05-08 ENCOUNTER — Ambulatory Visit (HOSPITAL_COMMUNITY): Payer: Medicare Other

## 2018-05-08 ENCOUNTER — Ambulatory Visit (HOSPITAL_COMMUNITY): Payer: Medicare Other | Admitting: Anesthesiology

## 2018-05-08 ENCOUNTER — Encounter (HOSPITAL_COMMUNITY)
Admission: RE | Disposition: A | Discharge status: Home / Self Care | Payer: Self-pay | Source: Ambulatory Visit | Attending: General Surgery

## 2018-05-08 ENCOUNTER — Encounter (HOSPITAL_COMMUNITY): Payer: Self-pay | Admitting: Surgery

## 2018-05-08 DIAGNOSIS — Z880 Allergy status to penicillin: Secondary | ICD-10-CM | POA: Insufficient documentation

## 2018-05-08 DIAGNOSIS — Z6835 Body mass index (BMI) 35.0-35.9, adult: Secondary | ICD-10-CM | POA: Diagnosis not present

## 2018-05-08 DIAGNOSIS — K8010 Calculus of gallbladder with chronic cholecystitis without obstruction: Secondary | ICD-10-CM | POA: Insufficient documentation

## 2018-05-08 DIAGNOSIS — Z79899 Other long term (current) drug therapy: Secondary | ICD-10-CM | POA: Insufficient documentation

## 2018-05-08 DIAGNOSIS — Z853 Personal history of malignant neoplasm of breast: Secondary | ICD-10-CM | POA: Diagnosis not present

## 2018-05-08 DIAGNOSIS — K811 Chronic cholecystitis: Secondary | ICD-10-CM | POA: Diagnosis not present

## 2018-05-08 DIAGNOSIS — K915 Postcholecystectomy syndrome: Secondary | ICD-10-CM | POA: Diagnosis not present

## 2018-05-08 DIAGNOSIS — Z419 Encounter for procedure for purposes other than remedying health state, unspecified: Secondary | ICD-10-CM

## 2018-05-08 DIAGNOSIS — I10 Essential (primary) hypertension: Secondary | ICD-10-CM | POA: Insufficient documentation

## 2018-05-08 DIAGNOSIS — E669 Obesity, unspecified: Secondary | ICD-10-CM | POA: Diagnosis not present

## 2018-05-08 HISTORY — PX: CHOLECYSTECTOMY: SHX55

## 2018-05-08 HISTORY — DX: Pure hypercholesterolemia, unspecified: E78.00

## 2018-05-08 HISTORY — DX: Calculus of gallbladder without cholecystitis without obstruction: K80.20

## 2018-05-08 SURGERY — LAPAROSCOPIC CHOLECYSTECTOMY WITH INTRAOPERATIVE CHOLANGIOGRAM
Anesthesia: General | Site: Abdomen

## 2018-05-08 MED ORDER — GABAPENTIN 300 MG PO CAPS
300.0000 mg | ORAL_CAPSULE | ORAL | Status: AC
  Administered 2018-05-08: 300 mg via ORAL

## 2018-05-08 MED ORDER — SUGAMMADEX SODIUM 200 MG/2ML IV SOLN
INTRAVENOUS | Status: DC | PRN
  Administered 2018-05-08: 177.8 mg via INTRAVENOUS

## 2018-05-08 MED ORDER — DEXAMETHASONE SODIUM PHOSPHATE 4 MG/ML IJ SOLN
INTRAMUSCULAR | Status: DC | PRN
  Administered 2018-05-08: 5 mg via INTRAVENOUS

## 2018-05-08 MED ORDER — OXYCODONE HCL 5 MG PO TABS: 5.0000 mg | ORAL_TABLET | Freq: Four times a day (QID) | ORAL | 0 refills | Status: DC | PRN

## 2018-05-08 MED ORDER — OXYCODONE HCL 5 MG/5ML PO SOLN: 5.0000 mg | Freq: Once | ORAL | Status: DC | PRN

## 2018-05-08 MED ORDER — CELECOXIB 200 MG PO CAPS
ORAL_CAPSULE | ORAL | Status: AC
  Administered 2018-05-08: 200 mg via ORAL
  Filled 2018-05-08: qty 1

## 2018-05-08 MED ORDER — ACETAMINOPHEN 500 MG PO TABS
1000.0000 mg | ORAL_TABLET | ORAL | Status: AC
  Administered 2018-05-08: 1000 mg via ORAL

## 2018-05-08 MED ORDER — LACTATED RINGERS IV SOLN: INTRAVENOUS | Status: DC

## 2018-05-08 MED ORDER — PHENYLEPHRINE HCL 10 MG/ML IJ SOLN
INTRAMUSCULAR | Status: DC | PRN
  Administered 2018-05-08: 80 ug via INTRAVENOUS

## 2018-05-08 MED ORDER — IOPAMIDOL (ISOVUE-300) INJECTION 61%
INTRAVENOUS | Status: AC
  Filled 2018-05-08: qty 50

## 2018-05-08 MED ORDER — FENTANYL CITRATE (PF) 100 MCG/2ML IJ SOLN
25.0000 ug | INTRAMUSCULAR | Status: DC | PRN
  Administered 2018-05-08: 50 ug via INTRAVENOUS

## 2018-05-08 MED ORDER — MIDAZOLAM HCL 2 MG/2ML IJ SOLN: 1.0000 mg | INTRAMUSCULAR | Status: DC | PRN

## 2018-05-08 MED ORDER — PROPOFOL 10 MG/ML IV BOLUS
INTRAVENOUS | Status: AC
  Filled 2018-05-08: qty 40

## 2018-05-08 MED ORDER — PROPOFOL 10 MG/ML IV BOLUS
INTRAVENOUS | Status: DC | PRN
  Administered 2018-05-08: 150 mg via INTRAVENOUS

## 2018-05-08 MED ORDER — ONDANSETRON HCL 4 MG/2ML IJ SOLN
4.0000 mg | Freq: Once | INTRAMUSCULAR | Status: AC | PRN
  Administered 2018-05-08: 4 mg via INTRAVENOUS

## 2018-05-08 MED ORDER — FENTANYL CITRATE (PF) 250 MCG/5ML IJ SOLN
INTRAMUSCULAR | Status: AC
  Filled 2018-05-08: qty 5

## 2018-05-08 MED ORDER — MIDAZOLAM HCL 2 MG/2ML IJ SOLN
INTRAMUSCULAR | Status: AC
  Filled 2018-05-08: qty 2

## 2018-05-08 MED ORDER — CIPROFLOXACIN IN D5W 400 MG/200ML IV SOLN
INTRAVENOUS | Status: AC
  Filled 2018-05-08: qty 200

## 2018-05-08 MED ORDER — BUPIVACAINE-EPINEPHRINE (PF) 0.5% -1:200000 IJ SOLN
INTRAMUSCULAR | Status: AC
  Filled 2018-05-08: qty 30

## 2018-05-08 MED ORDER — CIPROFLOXACIN IN D5W 400 MG/200ML IV SOLN
400.0000 mg | INTRAVENOUS | Status: AC
  Administered 2018-05-08: 400 mg via INTRAVENOUS

## 2018-05-08 MED ORDER — FENTANYL CITRATE (PF) 100 MCG/2ML IJ SOLN: 50.0000 ug | INTRAMUSCULAR | Status: DC | PRN

## 2018-05-08 MED ORDER — SCOPOLAMINE 1 MG/3DAYS TD PT72
1.0000 | MEDICATED_PATCH | Freq: Once | TRANSDERMAL | Status: DC | PRN
  Administered 2018-05-08: 1.5 mg via TRANSDERMAL

## 2018-05-08 MED ORDER — GABAPENTIN 300 MG PO CAPS
ORAL_CAPSULE | ORAL | Status: AC
  Administered 2018-05-08: 300 mg via ORAL
  Filled 2018-05-08: qty 1

## 2018-05-08 MED ORDER — CELECOXIB 200 MG PO CAPS
200.0000 mg | ORAL_CAPSULE | ORAL | Status: AC
  Administered 2018-05-08: 200 mg via ORAL

## 2018-05-08 MED ORDER — OXYCODONE HCL 5 MG PO TABS: 5.0000 mg | ORAL_TABLET | Freq: Once | ORAL | Status: DC | PRN

## 2018-05-08 MED ORDER — SCOPOLAMINE 1 MG/3DAYS TD PT72
MEDICATED_PATCH | TRANSDERMAL | Status: AC
  Administered 2018-05-08: 1.5 mg via TRANSDERMAL
  Filled 2018-05-08: qty 1

## 2018-05-08 MED ORDER — ONDANSETRON HCL 4 MG/2ML IJ SOLN
INTRAMUSCULAR | Status: DC | PRN
  Administered 2018-05-08: 4 mg via INTRAVENOUS

## 2018-05-08 MED ORDER — ACETAMINOPHEN 500 MG PO TABS
ORAL_TABLET | ORAL | Status: AC
  Administered 2018-05-08: 1000 mg via ORAL
  Filled 2018-05-08: qty 2

## 2018-05-08 MED ORDER — ONDANSETRON HCL 4 MG/2ML IJ SOLN
INTRAMUSCULAR | Status: AC
  Filled 2018-05-08: qty 2

## 2018-05-08 MED ORDER — FENTANYL CITRATE (PF) 100 MCG/2ML IJ SOLN
INTRAMUSCULAR | Status: AC
  Filled 2018-05-08: qty 2

## 2018-05-08 MED ORDER — CHLORHEXIDINE GLUCONATE CLOTH 2 % EX PADS: 6.0000 | MEDICATED_PAD | Freq: Once | CUTANEOUS | Status: DC

## 2018-05-08 MED ORDER — EPHEDRINE SULFATE 50 MG/ML IJ SOLN
INTRAMUSCULAR | Status: DC | PRN
  Administered 2018-05-08: 5 mg via INTRAVENOUS

## 2018-05-08 MED ORDER — SODIUM CHLORIDE 0.9 % IR SOLN
Status: DC | PRN
  Administered 2018-05-08: 1000 mL

## 2018-05-08 MED ORDER — LIDOCAINE HCL (CARDIAC) PF 100 MG/5ML IV SOSY
PREFILLED_SYRINGE | INTRAVENOUS | Status: DC | PRN
  Administered 2018-05-08: 100 mg via INTRAVENOUS

## 2018-05-08 MED ORDER — SODIUM CHLORIDE 0.9 % IV SOLN
INTRAVENOUS | Status: DC | PRN
  Administered 2018-05-08: 12 mL

## 2018-05-08 MED ORDER — ROCURONIUM BROMIDE 100 MG/10ML IV SOLN
INTRAVENOUS | Status: DC | PRN
  Administered 2018-05-08: 50 mg via INTRAVENOUS

## 2018-05-08 MED ORDER — BUPIVACAINE-EPINEPHRINE 0.5% -1:200000 IJ SOLN
INTRAMUSCULAR | Status: DC | PRN
  Administered 2018-05-08: 10 mL

## 2018-05-08 MED ORDER — 0.9 % SODIUM CHLORIDE (POUR BTL) OPTIME
TOPICAL | Status: DC | PRN
  Administered 2018-05-08: 500 mL

## 2018-05-08 MED ORDER — FENTANYL CITRATE (PF) 250 MCG/5ML IJ SOLN
INTRAMUSCULAR | Status: DC | PRN
  Administered 2018-05-08: 50 ug via INTRAVENOUS
  Administered 2018-05-08: 100 ug via INTRAVENOUS

## 2018-05-08 SURGICAL SUPPLY — 48 items
ADH SKN CLS APL DERMABOND .7 (GAUZE/BANDAGES/DRESSINGS) ×1
APPLIER CLIP ROT 10 11.4 M/L (STAPLE) ×3
APR CLP MED LRG 11.4X10 (STAPLE) ×1
BAG SPEC RTRVL 10 TROC 200 (ENDOMECHANICALS) ×1
BLADE CLIPPER SURG (BLADE) IMPLANT
CANISTER SUCT 3000ML PPV (MISCELLANEOUS) ×3 IMPLANT
CHLORAPREP W/TINT 26ML (MISCELLANEOUS) ×3 IMPLANT
CLIP APPLIE ROT 10 11.4 M/L (STAPLE) ×1 IMPLANT
COVER MAYO STAND STRL (DRAPES) ×3 IMPLANT
COVER SURGICAL LIGHT HANDLE (MISCELLANEOUS) ×3 IMPLANT
DERMABOND ADVANCED (GAUZE/BANDAGES/DRESSINGS) ×2
DERMABOND ADVANCED .7 DNX12 (GAUZE/BANDAGES/DRESSINGS) ×1 IMPLANT
DRAPE C-ARM 42X72 X-RAY (DRAPES) ×3 IMPLANT
ELECT REM PT RETURN 9FT ADLT (ELECTROSURGICAL) ×3
ELECTRODE REM PT RTRN 9FT ADLT (ELECTROSURGICAL) ×1 IMPLANT
GLOVE BIO SURGEON STRL SZ 6.5 (GLOVE) ×3 IMPLANT
GLOVE BIO SURGEONS STRL SZ 6.5 (GLOVE) ×3
GLOVE BIOGEL PI IND STRL 6.5 (GLOVE) IMPLANT
GLOVE BIOGEL PI IND STRL 7.0 (GLOVE) IMPLANT
GLOVE BIOGEL PI IND STRL 8 (GLOVE) ×1 IMPLANT
GLOVE BIOGEL PI INDICATOR 6.5 (GLOVE) ×6
GLOVE BIOGEL PI INDICATOR 7.0 (GLOVE) ×2
GLOVE BIOGEL PI INDICATOR 8 (GLOVE) ×2
GLOVE ECLIPSE 7.5 STRL STRAW (GLOVE) ×3 IMPLANT
GOWN STRL REUS W/ TWL LRG LVL3 (GOWN DISPOSABLE) ×2 IMPLANT
GOWN STRL REUS W/ TWL XL LVL3 (GOWN DISPOSABLE) ×1 IMPLANT
GOWN STRL REUS W/TWL LRG LVL3 (GOWN DISPOSABLE) ×9
GOWN STRL REUS W/TWL XL LVL3 (GOWN DISPOSABLE) ×3
KIT BASIN OR (CUSTOM PROCEDURE TRAY) ×3 IMPLANT
KIT TURNOVER KIT B (KITS) ×3 IMPLANT
NS IRRIG 1000ML POUR BTL (IV SOLUTION) ×3 IMPLANT
PAD ARMBOARD 7.5X6 YLW CONV (MISCELLANEOUS) ×3 IMPLANT
POUCH RETRIEVAL ECOSAC 10 (ENDOMECHANICALS) ×1 IMPLANT
POUCH RETRIEVAL ECOSAC 10MM (ENDOMECHANICALS) ×2
SCISSORS LAP 5X35 DISP (ENDOMECHANICALS) ×3 IMPLANT
SET CHOLANGIOGRAPH 5 50 .035 (SET/KITS/TRAYS/PACK) ×3 IMPLANT
SET IRRIG TUBING LAPAROSCOPIC (IRRIGATION / IRRIGATOR) ×3 IMPLANT
SLEEVE ENDOPATH XCEL 5M (ENDOMECHANICALS) ×3 IMPLANT
SPECIMEN JAR SMALL (MISCELLANEOUS) ×3 IMPLANT
SUT MON AB 5-0 PS2 18 (SUTURE) ×3 IMPLANT
TOWEL OR 17X24 6PK STRL BLUE (TOWEL DISPOSABLE) ×1 IMPLANT
TOWEL OR 17X26 10 PK STRL BLUE (TOWEL DISPOSABLE) ×3 IMPLANT
TRAY LAPAROSCOPIC MC (CUSTOM PROCEDURE TRAY) ×3 IMPLANT
TROCAR XCEL BLUNT TIP 100MML (ENDOMECHANICALS) ×3 IMPLANT
TROCAR XCEL NON-BLD 11X100MML (ENDOMECHANICALS) ×3 IMPLANT
TROCAR XCEL NON-BLD 5MMX100MML (ENDOMECHANICALS) ×3 IMPLANT
TUBING INSUFFLATION (TUBING) ×1 IMPLANT
WATER STERILE IRR 1000ML POUR (IV SOLUTION) ×3 IMPLANT

## 2018-05-08 NOTE — Anesthesia Postprocedure Evaluation (Signed)
Anesthesia Post Note  Patient: Margaret Hunter  Procedure(s) Performed: LAPAROSCOPIC CHOLECYSTECTOMY WITH INTRAOPERATIVE CHOLANGIOGRAM (N/A Abdomen)     Patient location during evaluation: PACU Anesthesia Type: General Level of consciousness: awake and alert Pain management: pain level controlled Vital Signs Assessment: post-procedure vital signs reviewed and stable Respiratory status: spontaneous breathing, nonlabored ventilation and respiratory function stable Cardiovascular status: blood pressure returned to baseline and stable Postop Assessment: no apparent nausea or vomiting Anesthetic complications: no    Last Vitals:  Vitals:   05/08/18 0925 05/08/18 0945  BP: (!) 124/56 (!) 126/59  Pulse: (!) 50 (!) 53  Resp: 10 12  Temp:    SpO2: 95% 94%    Last Pain:  Vitals:   05/08/18 0945  TempSrc:   PainSc: Ruth

## 2018-05-08 NOTE — Interval H&P Note (Signed)
History and Physical Interval Note:  05/08/2018 7:34 AM  Margaret Hunter  has presented today for surgery, with the diagnosis of CHOLELITHIASIS  The various methods of treatment have been discussed with the patient and family. After consideration of risks, benefits and other options for treatment, the patient has consented to  Procedure(s): LAPAROSCOPIC CHOLECYSTECTOMY WITH INTRAOPERATIVE CHOLANGIOGRAM (N/A) as a surgical intervention .  The patient's history has been reviewed, patient examined, no change in status, stable for surgery.  I have reviewed the patient's chart and labs.  Questions were answered to the patient's satisfaction.     Darene Lamer Dima Mini

## 2018-05-08 NOTE — Discharge Instructions (Signed)
[facility] ______[facility] °LAPAROSCOPIC SURGERY: POST OP INSTRUCTIONS °Always review your discharge instruction sheet given to you by the facility where your surgery was performed. °IF YOU HAVE DISABILITY OR FAMILY LEAVE FORMS, YOU MUST BRING THEM TO THE OFFICE FOR PROCESSING.   °DO NOT GIVE THEM TO YOUR DOCTOR. ° °1. A prescription for pain medication may be given to you upon discharge.  Take your pain medication as prescribed, if needed.  If narcotic pain medicine is not needed, then you may take acetaminophen (Tylenol) or ibuprofen (Advil) as needed. °2. Take your usually prescribed medications unless otherwise directed. °3. If you need a refill on your pain medication, please contact your pharmacy.  They will contact our office to request authorization. Prescriptions will not be filled after 5pm or on week-ends. °4. You should follow a light diet the first few days after arrival home, such as soup and crackers, etc.  Be sure to include lots of fluids daily. °5. Most patients will experience some swelling and bruising in the area of the incisions.  Ice packs will help.  Swelling and bruising can take several days to resolve.  °6. It is common to experience some constipation if taking pain medication after surgery.  Increasing fluid intake and taking a stool softener (such as Colace) will usually help or prevent this problem from occurring.  A mild laxative (Milk of Magnesia or Miralax) should be taken according to package instructions if there are no bowel movements after 48 hours. °7. Unless discharge instructions indicate otherwise, you may remove your bandages 24-48 hours after surgery, and you may shower at that time.  You may have steri-strips (small skin tapes) in place directly over the incision.  These strips should be left on the skin for 7-10 days.  If your surgeon used skin glue on the incision, you may shower in 24 hours.  The glue will flake off over the next 2-3 weeks.  Any sutures or  staples will be removed at the office during your follow-up visit. °8. ACTIVITIES:  You may resume regular (light) daily activities beginning the next day--such as daily self-care, walking, climbing stairs--gradually increasing activities as tolerated.  You may have sexual intercourse when it is comfortable.  Refrain from any heavy lifting or straining until approved by your doctor. °a. You may drive when you are no longer taking prescription pain medication, you can comfortably wear a seatbelt, and you can safely maneuver your car and apply brakes. °b. RETURN TO WORK:  __________________________________________________________ °9. You should see your doctor in the office for a follow-up appointment approximately 2-3 weeks after your surgery.  Make sure that you call for this appointment within a day or two after you arrive home to insure a convenient appointment time. °10. OTHER INSTRUCTIONS: __________________________________________________________________________________________________________________________ __________________________________________________________________________________________________________________________ °WHEN TO CALL YOUR DOCTOR: °1. Fever over 101.0 °2. Inability to urinate °3. Continued bleeding from incision. °4. Increased pain, redness, or drainage from the incision. °5. Increasing abdominal pain ° °The clinic staff is available to answer your questions during regular business hours.  Please don’t hesitate to call and ask to speak to one of the nurses for clinical concerns.  If you have a medical emergency, go to the nearest emergency room or call 911.  A surgeon from [facility] is always on call at the hospital. °[address], [address], [place], [place]  [address] ? [address] [address], [place], [place]   [address] °[phone] ? [phone] ? FAX [phone] °Web site:   [email or web address] °

## 2018-05-08 NOTE — H&P (Signed)
  History of Present Illness  The patient is a 72 year old female known to me from recent breast cancer diagnosis and surgery.  During follow-up she developed an episode of abdominal pain. About 2-1/2 months ago she had an episode of acute onset of fairly severe crampy-like right upper quadrant abdominal pain. This lasted several hours and gradually resolved after taking a pain pill. She has not had any further similar symptoms. No nausea or vomiting or jaundice.  Gallbladder ultrasound was obtained.  This has shown a large gallbladder polyp and small gallstones.  After discussion with the patient regarding alternatives we elected to proceed with cholecystectomy.   Allergies  Penicillin G Benzathine & Proc *PENICILLINS*  Allergies Reconciled   Medication History  Zocor (40MG  Tablet, Oral daily) Active. Losartan Potassium-HCTZ (100-25MG  Tablet, Oral daily) Active. Letrozole (2.5MG  Tablet, Oral) Active. Turmeric (500MG  Tablet, Oral) Active. Multi-Vitamin (Oral) Active. Medications Reconciled  Vitals   Weight: 200.8 lb Height: 62in Body Surface Area: 1.91 m Body Mass Index: 36.73 kg/m  Temp.: 98.27F  Pulse: 74 (Regular)        Physical Exam The physical exam findings are as follows: Note:General: Appears well, mildly obese Lungs: Clear easy respirations Cardiac: Regular rate and rhythm.  No murmurs. Breasts: Healing right breast and axillary incisions without compounding factors Abdomen: Soft and nontender.  No palpable masses.  No organomegaly.  Well-healed small loop midline incision Extremities: No edema or deformity Neurologic: Alert and fully oriented.  Gait normal.    Assessment and plan: Recent episode of abdominal pain entirely consistent with biliary colic.  She has a good-sized polyp in her gallbladder as well as small stones.  I believe she would be best served with cholecystectomy to prevent further episodes and complications particularly with a  polyp which potentially could be neoplastic.  The patient is in agreement.I discussed the procedure in detail.  The patient was given Neurosurgeon.  We discussed the risks and benefits of a laparoscopic cholecystectomy and possible cholangiogram including, but not limited to bleeding, infection, injury to surrounding structures such as the intestine or liver, bile leak, retained gallstones, need to convert to an open procedure, prolonged diarrhea, blood clots such as  DVT, common bile duct injury, anesthesia risks, and possible need for additional procedures.  The likelihood of improvement in symptoms and return to the patient's normal status is good. We discussed the typical post-operative recovery course.

## 2018-05-08 NOTE — Transfer of Care (Signed)
Immediate Anesthesia Transfer of Care Note  Patient: Margaret Hunter  Procedure(s) Performed: LAPAROSCOPIC CHOLECYSTECTOMY WITH INTRAOPERATIVE CHOLANGIOGRAM (N/A Abdomen)  Patient Location: PACU  Anesthesia Type:General  Level of Consciousness: awake, alert , oriented, patient cooperative and responds to stimulation  Airway & Oxygen Therapy: Patient Spontanous Breathing and Patient connected to face mask oxygen  Post-op Assessment: Report given to RN, Post -op Vital signs reviewed and stable and Patient moving all extremities X 4  Post vital signs: Reviewed and stable  Last Vitals:  Vitals Value Taken Time  BP    Temp    Pulse 60 05/08/2018  8:54 AM  Resp 14 05/08/2018  8:54 AM  SpO2 100 % 05/08/2018  8:54 AM  Vitals shown include unvalidated device data.  Last Pain:  Vitals:   05/08/18 0624  TempSrc:   PainSc: 0-No pain      Patients Stated Pain Goal: 4 (27/25/36 6440)  Complications: No apparent anesthesia complications

## 2018-05-08 NOTE — Op Note (Signed)
Preoperative diagnosis: Cholelithiasis and cholecystitis  Postoperative diagnosis: Cholelithiasis and cholecystitis  Surgical procedure: Laparoscopic cholecystectomy with intraoperative cholangiogram  Surgeon: Marland Kitchen T. Neria Procter M.D.  Assistant: None  Anesthesia: General Endotracheal  Complications: None  Estimated blood loss: Minimal  Description of procedure: The patient brought to the operating room, placed in the supine position on the operating table, and general endotracheal anesthesia induced. The abdomen was widely sterilely prepped and draped. The patient had received preoperative IV antibiotics and PAS were in place. Patient timeout was performed the correct procedure verified. Standard 4 port technique was used with an open Hassan cannula at the umbilicus and the remainder of the ports placed under direct vision. The gallbladder was visualized. It appeared mildly chronically inflamed with omental adhesions. The fundus was grasped and elevated up over the liver and the infundibulum retracted inferiolaterally. Peritoneum anterior and posterior to close triangle was incised and fibrofatty tissue stripped off the neck of the gallbladder toward the porta hepatis. The distal gallbladder was thoroughly dissected. The cystic artery was identified in Calot's triangle and the cystic duct gallbladder junction dissected 360.  A good critical view was obtained. When the anatomy was clear the cystic duct was clipped at the gallbladder junction and an operative cholangiogram obtained through the cystic duct. This showed good filling of a normal common bile duct and intrahepatic ducts with free flow into the duodenum and no filling defects. Following this the cholangiocath was removed and the cystic duct was doubly clipped proximally and divided. The cystic artery was doubly clipped proximally and distally and divided. The gallbladder was dissected free from its bed using hook cautery and removed  through the umbilical port site. Complete hemostasis was obtained in the gallbladder bed. The right upper quadrant was thoroughly irrigated and hemostasis assured. Trochars were removed and all CO2 evacuated and the [facility] trocar site fascial defect closed. Skin incisions were closed with subcuticular Monocryl and Dermabond. Sponge needle and instrument counts were correct. The patient was taken to PACU in good condition.  Darene Lamer Adalyn Pennock  05/08/2018

## 2018-05-08 NOTE — Anesthesia Procedure Notes (Signed)
Procedure Name: Intubation Date/Time: 05/08/2018 7:39 AM Performed by: Glynda Jaeger, CRNA Pre-anesthesia Checklist: Patient identified, Patient being monitored, Timeout performed, Emergency Drugs available and Suction available Patient Re-evaluated:Patient Re-evaluated prior to induction Oxygen Delivery Method: Circle System Utilized Preoxygenation: Pre-oxygenation with 100% oxygen Induction Type: IV induction Ventilation: Mask ventilation without difficulty Laryngoscope Size: Mac and 4 Grade View: Grade I Tube type: Oral Tube size: 7.0 mm Number of attempts: 1 Airway Equipment and Method: Stylet Placement Confirmation: ETT inserted through vocal cords under direct vision,  positive ETCO2 and breath sounds checked- equal and bilateral Secured at: 22 cm Tube secured with: Tape Dental Injury: Teeth and Oropharynx as per pre-operative assessment

## 2018-05-09 ENCOUNTER — Encounter (HOSPITAL_COMMUNITY): Payer: Self-pay | Admitting: General Surgery

## 2018-05-29 ENCOUNTER — Inpatient Hospital Stay: Payer: Medicare Other | Attending: Hematology and Oncology | Admitting: Hematology and Oncology

## 2018-05-29 ENCOUNTER — Telehealth: Payer: Self-pay | Admitting: Hematology and Oncology

## 2018-05-29 DIAGNOSIS — C50211 Malignant neoplasm of upper-inner quadrant of right female breast: Secondary | ICD-10-CM | POA: Insufficient documentation

## 2018-05-29 DIAGNOSIS — Z79899 Other long term (current) drug therapy: Secondary | ICD-10-CM | POA: Diagnosis not present

## 2018-05-29 DIAGNOSIS — Z79811 Long term (current) use of aromatase inhibitors: Secondary | ICD-10-CM | POA: Insufficient documentation

## 2018-05-29 DIAGNOSIS — Z170 Estrogen receptor positive status [ER+]: Secondary | ICD-10-CM

## 2018-05-29 DIAGNOSIS — Z7982 Long term (current) use of aspirin: Secondary | ICD-10-CM | POA: Insufficient documentation

## 2018-05-29 MED ORDER — ASPIRIN EC 81 MG PO TBEC: 81.0000 mg | DELAYED_RELEASE_TABLET | Freq: Every day | ORAL | Status: DC

## 2018-05-29 NOTE — Assessment & Plan Note (Addendum)
02/20/2018:Right lumpectomy: IDC with DCIS, 0.6 cm, grade 1, 0/2 lymph nodes negative, T1 b N0 stage Ia, ER 100%, PR 95%, HER-2 negative ratio 1.24 Genetic testing negative  Treatment plan: 1. Adjuvant radiation: Patient previously discussed the pros and cons of adjuvant radiation and decided that she does not want to pursue radiation because her tumor is very favorable and she is over the age of 45. 2. Adjuvant antiestrogen therapy with letrozole 2.5 mg daily x5 to 7 years started 02/27/2018.   Letrozole toxicities:  Return to clinic in 6 months for follow-up and after that we can see her once a year

## 2018-05-29 NOTE — Telephone Encounter (Signed)
Gave pt avs and calendar  °

## 2018-05-29 NOTE — Progress Notes (Signed)
Patient Care Team: Harlan Stains, MD as PCP - General (Family Medicine) Excell Seltzer, MD as Consulting Physician (General Surgery) Nicholas Lose, MD as Consulting Physician (Hematology and Oncology) Gery Pray, MD as Consulting Physician (Radiation Oncology)  DIAGNOSIS:  Encounter Diagnosis  Name Primary?  . Malignant neoplasm of upper-inner quadrant of right breast in female, estrogen receptor positive ([place])     SUMMARY OF ONCOLOGIC HISTORY:   Malignant neoplasm of upper-inner quadrant of right breast in female, estrogen receptor positive ([place])   01/30/2018 Initial Diagnosis    Screening detected right breast mass 0.5 cm at 1 o'clock position 9 cm from nipple, axilla negative biopsy: Invasive ductal carcinoma and a papillary lesion grade 1-2, ER PR positive HER-2 negative Ki-67 20%, T1 a N0 stage I a clinical stage AJCC 8    02/13/2018 Genetic Testing    The Common Hereditary Cancers Panel + Myelodysplastic Syndrome/Leukemia Panel was ordered (54 genes) The following genes were evaluated for sequence changes and exonic deletions/duplications: APC, ATM, AXIN2, BARD1, BLM, BMPR1A, BRCA1, BRCA2, BRIP1, CDH1, CDK4, CDKN2A (p14ARF), CDKN2A (p16INK4a), CEBPA, CHEK2, CTNNA1, DICER1, EPCAM*, GATA2, GREM1*, HRAS, KIT, MEN1, MLH1, MSH2, MSH3, MSH6, MUTYH, NBN, NF1, PALB2, PDGFRA, PMS2, POLD1, POLE, PTEN, RAD50, RAD51C, RAD51D, RUNX1, SDHB, SDHC, SDHD, SMAD4, SMARCA4, STK11, TERC, TERT, TP53, TSC1, TSC2, VHL The following genes were evaluated for sequence changes only:HOXB13*, NTHL1*, SDHA  Results: Negative, no pathogenic variants identified.  The date of this test report is 02/13/2018.     02/20/2018 Surgery    Right lumpectomy: IDC with DCIS, 0.6 cm, grade 1, 0/2 lymph nodes negative, T1 b N0 stage Ia, ER 100%, PR 95%, HER-2 negative ratio 1.24    03/29/2018 -  Anti-estrogen oral therapy    Letrozole 2.5 mg daily     CHIEF COMPLIANT: Follow-up on letrozole therapy  INTERVAL  HISTORY: Margaret Hunter is a 72 year old with above-mentioned history of right breast cancer treated with lumpectomy and is now on antiestrogen therapy with letrozole.  She appears to be tolerating it extremely well.  She has no side effects or concerns.  She had a gallbladder surgery recently and is healing and recovered very well from it.  REVIEW OF SYSTEMS:   Constitutional: Denies fevers, chills or abnormal weight loss Eyes: Denies blurriness of vision Ears, nose, mouth, throat, and face: Denies mucositis or sore throat Respiratory: Denies cough, dyspnea or wheezes Cardiovascular: Denies palpitation, chest discomfort Gastrointestinal:  Denies nausea, heartburn or change in bowel habits Skin: Denies abnormal skin rashes Lymphatics: Denies new lymphadenopathy or easy bruising Neurological:Denies numbness, tingling or new weaknesses Behavioral/Psych: Mood is stable, no new changes  Extremities: No lower extremity edema Breast:  denies any pain or lumps or nodules in either breasts All other systems were reviewed with the patient and are negative.  I have reviewed the past medical history, past surgical history, social history and family history with the patient and they are unchanged from previous note.  ALLERGIES:  is allergic to penicillin g.  MEDICATIONS:  Current Outpatient Medications  Medication Sig Dispense Refill  . aspirin EC 81 MG tablet Take 1 tablet (81 mg total) by mouth daily.    . Black Pepper-Turmeric 3-500 MG CAPS Take 500 mg by mouth 2 (two) times daily.    . Calcium Carbonate-Vit D-Min (CALCIUM 600+D PLUS MINERALS) 600-400 MG-UNIT CHEW Chew 1 each by mouth daily.    . cholecalciferol (VITAMIN D) 1000 units tablet Take 2,000 Units by mouth daily.    Marland Kitchen letrozole [facility])  2.5 MG tablet Take 1 tablet (2.5 mg total) by mouth daily. 90 tablet 3  . losartan-hydrochlorothiazide (HYZAAR) 100-25 MG tablet Take 1 tablet by mouth daily.     . Multiple Vitamin (MULTIVITAMIN)  tablet Take 1 tablet by mouth daily.    . simvastatin (ZOCOR) 40 MG tablet Take 40 mg by mouth daily.   2   No current facility-administered medications for this visit.     PHYSICAL EXAMINATION: ECOG PERFORMANCE STATUS: 1 - Symptomatic but completely ambulatory  Vitals:   05/29/18 0953  BP: 136/61  Pulse: 65  Resp: 16  Temp: 97.9 F (36.6 C)  SpO2: 98%   Filed Weights   05/29/18 0953  Weight: 197 lb 8 oz (89.6 kg)    GENERAL:alert, no distress and comfortable SKIN: skin color, texture, turgor are normal, no rashes or significant lesions EYES: normal, Conjunctiva are pink and non-injected, sclera clear OROPHARYNX:no exudate, no erythema and lips, buccal mucosa, and tongue normal  NECK: supple, thyroid normal size, non-tender, without nodularity LYMPH:  no palpable lymphadenopathy in the cervical, axillary or inguinal LUNGS: clear to auscultation and percussion with normal breathing effort HEART: regular rate & rhythm and no murmurs and no lower extremity edema ABDOMEN:abdomen soft, non-tender and normal bowel sounds MUSCULOSKELETAL:no cyanosis of digits and no clubbing  NEURO: alert & oriented x 3 with fluent speech, no focal motor/sensory deficits EXTREMITIES: No lower extremity edema   LABORATORY DATA:  I have reviewed the data as listed CMP Latest Ref Rng & Units 05/01/2018 02/04/2018  Glucose 70 - 99 mg/dL 118(H) 103  BUN 8 - 23 mg/dL 20 19  Creatinine 0.44 - 1.00 mg/dL 0.66 0.77  Sodium 135 - 145 mmol/L 140 141  Potassium 3.5 - 5.1 mmol/L 3.7 3.8  Chloride 98 - 111 mmol/L 104 106  CO2 22 - 32 mmol/L 27 28  Calcium 8.9 - 10.3 mg/dL 10.4(H) 10.7(H)  Total Protein 6.4 - 8.3 g/dL - 7.0  Total Bilirubin 0.2 - 1.2 mg/dL - 0.5  Alkaline Phos 40 - 150 U/L - 101  AST 5 - 34 U/L - 14  ALT 0 - 55 U/L - 16    Lab Results  Component Value Date   WBC 8.2 02/04/2018   HGB 13.2 02/04/2018   HCT 40.4 02/04/2018   MCV 86.8 02/04/2018   PLT 252 02/04/2018   NEUTROABS 5.7  02/04/2018    ASSESSMENT & PLAN:  Malignant neoplasm of upper-inner quadrant of right breast in female, estrogen receptor positive ([place]) 02/20/2018:Right lumpectomy: IDC with DCIS, 0.6 cm, grade 1, 0/2 lymph nodes negative, T1 b N0 stage Ia, ER 100%, PR 95%, HER-2 negative ratio 1.24 Genetic testing negative  Treatment plan: 1. Adjuvant radiation: Patient previously discussed the pros and cons of adjuvant radiation and decided that she does not want to pursue radiation because her tumor is very favorable and she is over the age of 9. 2. Adjuvant antiestrogen therapy with letrozole 2.5 mg daily x5 to 7 years started 02/27/2018.   Recent gallbladder surgery went very well.  Letrozole toxicities: Denies any side effects to letrozole therapy.  Return to clinic in 6 months for follow-up and after that we can see her once a year   No orders of the defined types were placed in this encounter.  The patient has a good understanding of the overall plan. she agrees with it. she will call with any problems that may develop before the next visit here.   Harriette Ohara, MD 05/29/18

## 2018-07-02 DIAGNOSIS — Z23 Encounter for immunization: Secondary | ICD-10-CM | POA: Diagnosis not present

## 2018-10-05 DIAGNOSIS — Z6835 Body mass index (BMI) 35.0-35.9, adult: Secondary | ICD-10-CM | POA: Diagnosis not present

## 2018-10-05 DIAGNOSIS — R05 Cough: Secondary | ICD-10-CM | POA: Diagnosis not present

## 2018-10-05 DIAGNOSIS — G43909 Migraine, unspecified, not intractable, without status migrainosus: Secondary | ICD-10-CM | POA: Diagnosis not present

## 2018-10-05 DIAGNOSIS — E785 Hyperlipidemia, unspecified: Secondary | ICD-10-CM | POA: Diagnosis not present

## 2018-10-05 DIAGNOSIS — I10 Essential (primary) hypertension: Secondary | ICD-10-CM | POA: Diagnosis not present

## 2018-10-05 DIAGNOSIS — R7303 Prediabetes: Secondary | ICD-10-CM | POA: Diagnosis not present

## 2018-10-15 DIAGNOSIS — Z170 Estrogen receptor positive status [ER+]: Secondary | ICD-10-CM | POA: Diagnosis not present

## 2018-10-15 DIAGNOSIS — C50911 Malignant neoplasm of unspecified site of right female breast: Secondary | ICD-10-CM | POA: Diagnosis not present

## 2018-11-06 DIAGNOSIS — L905 Scar conditions and fibrosis of skin: Secondary | ICD-10-CM | POA: Diagnosis not present

## 2018-11-06 DIAGNOSIS — D225 Melanocytic nevi of trunk: Secondary | ICD-10-CM | POA: Diagnosis not present

## 2018-11-06 DIAGNOSIS — Z85828 Personal history of other malignant neoplasm of skin: Secondary | ICD-10-CM | POA: Diagnosis not present

## 2018-11-06 DIAGNOSIS — L820 Inflamed seborrheic keratosis: Secondary | ICD-10-CM | POA: Diagnosis not present

## 2018-11-06 DIAGNOSIS — D2272 Melanocytic nevi of left lower limb, including hip: Secondary | ICD-10-CM | POA: Diagnosis not present

## 2018-11-06 DIAGNOSIS — L814 Other melanin hyperpigmentation: Secondary | ICD-10-CM | POA: Diagnosis not present

## 2018-11-06 DIAGNOSIS — L570 Actinic keratosis: Secondary | ICD-10-CM | POA: Diagnosis not present

## 2018-11-06 DIAGNOSIS — D2239 Melanocytic nevi of other parts of face: Secondary | ICD-10-CM | POA: Diagnosis not present

## 2018-11-06 DIAGNOSIS — D485 Neoplasm of uncertain behavior of skin: Secondary | ICD-10-CM | POA: Diagnosis not present

## 2018-11-06 DIAGNOSIS — D1801 Hemangioma of skin and subcutaneous tissue: Secondary | ICD-10-CM | POA: Diagnosis not present

## 2018-11-06 DIAGNOSIS — L821 Other seborrheic keratosis: Secondary | ICD-10-CM | POA: Diagnosis not present

## 2018-11-10 ENCOUNTER — Telehealth: Payer: Self-pay | Admitting: Hematology and Oncology

## 2018-11-10 DIAGNOSIS — L570 Actinic keratosis: Secondary | ICD-10-CM | POA: Diagnosis not present

## 2018-11-10 DIAGNOSIS — Z85828 Personal history of other malignant neoplasm of skin: Secondary | ICD-10-CM | POA: Diagnosis not present

## 2018-11-10 NOTE — Telephone Encounter (Signed)
[facility] CME 2/28 moved f/u to 3/2. Spoke with patient.

## 2018-11-16 DIAGNOSIS — H2513 Age-related nuclear cataract, bilateral: Secondary | ICD-10-CM | POA: Diagnosis not present

## 2018-11-16 DIAGNOSIS — H25013 Cortical age-related cataract, bilateral: Secondary | ICD-10-CM | POA: Diagnosis not present

## 2018-11-27 ENCOUNTER — Ambulatory Visit: Payer: Medicare Other | Admitting: Hematology and Oncology

## 2018-11-30 ENCOUNTER — Telehealth: Payer: Self-pay | Admitting: Hematology and Oncology

## 2018-11-30 ENCOUNTER — Inpatient Hospital Stay: Payer: Medicare Other | Attending: Hematology and Oncology | Admitting: Hematology and Oncology

## 2018-11-30 DIAGNOSIS — Z170 Estrogen receptor positive status [ER+]: Secondary | ICD-10-CM

## 2018-11-30 DIAGNOSIS — C50211 Malignant neoplasm of upper-inner quadrant of right female breast: Secondary | ICD-10-CM

## 2018-11-30 DIAGNOSIS — Z79811 Long term (current) use of aromatase inhibitors: Secondary | ICD-10-CM | POA: Diagnosis not present

## 2018-11-30 DIAGNOSIS — Z7982 Long term (current) use of aspirin: Secondary | ICD-10-CM | POA: Diagnosis not present

## 2018-11-30 DIAGNOSIS — Z79899 Other long term (current) drug therapy: Secondary | ICD-10-CM

## 2018-11-30 DIAGNOSIS — M2560 Stiffness of unspecified joint, not elsewhere classified: Secondary | ICD-10-CM

## 2018-11-30 DIAGNOSIS — L603 Nail dystrophy: Secondary | ICD-10-CM | POA: Diagnosis not present

## 2018-11-30 MED ORDER — LETROZOLE 2.5 MG PO TABS: 2.5000 mg | ORAL_TABLET | Freq: Every day | ORAL | 3 refills | Status: DC

## 2018-11-30 NOTE — Telephone Encounter (Signed)
Gave avs and calendar ° °

## 2018-11-30 NOTE — Assessment & Plan Note (Signed)
02/20/2018:Right lumpectomy: IDC with DCIS, 0.6 cm, grade 1, 0/2 lymph nodes negative, T1 b N0 stage Ia, ER 100%, PR 95%, HER-2 negative ratio 1.24 Genetic testing negative  Treatment plan: 1. Adjuvant radiation:Patient previously discussed the pros and cons of adjuvant radiation and decided that she does not want to pursue radiation because her tumor is very favorable and she is over the age of 32. 2.Adjuvant antiestrogen therapywith letrozole 2.5 mg daily x5 to 7 years started 02/27/2018.   Recent gallbladder surgery went very well.  Letrozole toxicities: Denies any side effects to letrozole therapy.  Return to clinic in 1 year for follow-up

## 2018-11-30 NOTE — Progress Notes (Signed)
 Patient Care Team: White, Cynthia, MD as PCP - General (Family Medicine) Hoxworth, Benjamin, MD as Consulting Physician (General Surgery) Gudena, Vinay, MD as Consulting Physician (Hematology and Oncology) Kinard, James, MD as Consulting Physician (Radiation Oncology)  DIAGNOSIS:  Encounter Diagnosis  Name Primary?  . Malignant neoplasm of upper-inner quadrant of right breast in female, estrogen receptor positive (HCC)     SUMMARY OF ONCOLOGIC HISTORY:   Malignant neoplasm of upper-inner quadrant of right breast in female, estrogen receptor positive (HCC)   01/30/2018 Initial Diagnosis    Screening detected right breast mass 0.5 cm at 1 o'clock position 9 cm from nipple, axilla negative biopsy: Invasive ductal carcinoma and a papillary lesion grade 1-2, ER PR positive HER-2 negative Ki-67 20%, T1 a N0 stage I a clinical stage AJCC 8    02/13/2018 Genetic Testing    The Common Hereditary Cancers Panel + Myelodysplastic Syndrome/Leukemia Panel was ordered (54 genes) The following genes were evaluated for sequence changes and exonic deletions/duplications: APC, ATM, AXIN2, BARD1, BLM, BMPR1A, BRCA1, BRCA2, BRIP1, CDH1, CDK4, CDKN2A (p14ARF), CDKN2A (p16INK4a), CEBPA, CHEK2, CTNNA1, DICER1, EPCAM*, GATA2, GREM1*, HRAS, KIT, MEN1, MLH1, MSH2, MSH3, MSH6, MUTYH, NBN, NF1, PALB2, PDGFRA, PMS2, POLD1, POLE, PTEN, RAD50, RAD51C, RAD51D, RUNX1, SDHB, SDHC, SDHD, SMAD4, SMARCA4, STK11, TERC, TERT, TP53, TSC1, TSC2, VHL The following genes were evaluated for sequence changes only:HOXB13*, NTHL1*, SDHA  Results: Negative, no pathogenic variants identified.  The date of this test report is 02/13/2018.     02/20/2018 Surgery    Right lumpectomy: IDC with DCIS, 0.6 cm, grade 1, 0/2 lymph nodes negative, T1 b N0 stage Ia, ER 100%, PR 95%, HER-2 negative ratio 1.24    03/29/2018 -  Anti-estrogen oral therapy    Letrozole 2.5 mg daily     CHIEF COMPLIANT: Follow-up on letrozole therapy  INTERVAL  HISTORY: Margaret Hunter is a 73-year-old with above-mentioned to right breast cancer treated with lumpectomy and is currently on oral antiestrogen therapy with letrozole.  She is tolerating letrozole reasonably well.  She does complain of joint muscle aches and pains especially when she climbs up stairs.  She has noticed more hair thinning as well as nail splitting.  Denies any lumps or nodules in the breast.  REVIEW OF SYSTEMS:   Constitutional: Denies fevers, chills or abnormal weight loss Eyes: Denies blurriness of vision Ears, nose, mouth, throat, and face: Denies mucositis or sore throat Respiratory: Denies cough, dyspnea or wheezes Cardiovascular: Denies palpitation, chest discomfort Gastrointestinal:  Denies nausea, heartburn or change in bowel habits Skin: Denies abnormal skin rashes Lymphatics: Denies new lymphadenopathy or easy bruising Neurological:Denies numbness, tingling or new weaknesses Behavioral/Psych: Mood is stable, no new changes  Extremities: No lower extremity edema Breast:  denies any pain or lumps or nodules in either breasts All other systems were reviewed with the patient and are negative.  I have reviewed the past medical history, past surgical history, social history and family history with the patient and they are unchanged from previous note.  ALLERGIES:  is allergic to penicillin g.  MEDICATIONS:  Current Outpatient Medications  Medication Sig Dispense Refill  . aspirin EC 81 MG tablet Take 1 tablet (81 mg total) by mouth daily.    . Black Pepper-Turmeric 3-500 MG CAPS Take 500 mg by mouth 2 (two) times daily.    . Calcium Carbonate-Vit D-Min (CALCIUM 600+D PLUS MINERALS) 600-400 MG-UNIT CHEW Chew 1 each by mouth daily.    . cholecalciferol (VITAMIN D) 1000 units tablet Take   2,000 Units by mouth daily.    . letrozole (FEMARA) 2.5 MG tablet Take 1 tablet (2.5 mg total) by mouth daily. 90 tablet 3  . losartan-hydrochlorothiazide (HYZAAR) 100-25 MG tablet Take  1 tablet by mouth daily.     . Multiple Vitamin (MULTIVITAMIN) tablet Take 1 tablet by mouth daily.    . simvastatin (ZOCOR) 40 MG tablet Take 40 mg by mouth daily.   2   No current facility-administered medications for this visit.     PHYSICAL EXAMINATION: ECOG PERFORMANCE STATUS: 1 - Symptomatic but completely ambulatory  Vitals:   11/30/18 0933 11/30/18 0934  BP: (!) 145/73 (!) 145/73  Pulse: 70 70  Resp: 17 17  Temp: 98.4 F (36.9 C) 98.4 F (36.9 C)  SpO2: 98% 98%   Filed Weights   11/30/18 0933 11/30/18 0934  Weight: 187 lb 8 oz (85 kg) 187 lb 8 oz (85 kg)    GENERAL:alert, no distress and comfortable SKIN: skin color, texture, turgor are normal, no rashes or significant lesions EYES: normal, Conjunctiva are pink and non-injected, sclera clear OROPHARYNX:no exudate, no erythema and lips, buccal mucosa, and tongue normal  NECK: supple, thyroid normal size, non-tender, without nodularity LYMPH:  no palpable lymphadenopathy in the cervical, axillary or inguinal LUNGS: clear to auscultation and percussion with normal breathing effort HEART: regular rate & rhythm and no murmurs and no lower extremity edema ABDOMEN:abdomen soft, non-tender and normal bowel sounds MUSCULOSKELETAL:no cyanosis of digits and no clubbing  NEURO: alert & oriented x 3 with fluent speech, no focal motor/sensory deficits EXTREMITIES: No lower extremity edema BREAST: No palpable masses or nodules in either right or left breasts. No palpable axillary supraclavicular or infraclavicular adenopathy no breast tenderness or nipple discharge. (exam performed in the presence of a chaperone)  LABORATORY DATA:  I have reviewed the data as listed CMP Latest Ref Rng & Units 05/01/2018 02/04/2018  Glucose 70 - 99 mg/dL 118(H) 103  BUN 8 - 23 mg/dL 20 19  Creatinine 0.44 - 1.00 mg/dL 0.66 0.77  Sodium 135 - 145 mmol/L 140 141  Potassium 3.5 - 5.1 mmol/L 3.7 3.8  Chloride 98 - 111 mmol/L 104 106  CO2 22 - 32  mmol/L 27 28  Calcium 8.9 - 10.3 mg/dL 10.4(H) 10.7(H)  Total Protein 6.4 - 8.3 g/dL - 7.0  Total Bilirubin 0.2 - 1.2 mg/dL - 0.5  Alkaline Phos 40 - 150 U/L - 101  AST 5 - 34 U/L - 14  ALT 0 - 55 U/L - 16    Lab Results  Component Value Date   WBC 8.2 02/04/2018   HGB 13.2 02/04/2018   HCT 40.4 02/04/2018   MCV 86.8 02/04/2018   PLT 252 02/04/2018   NEUTROABS 5.7 02/04/2018    ASSESSMENT & PLAN:  Malignant neoplasm of upper-inner quadrant of right breast in female, estrogen receptor positive (HCC) 02/20/2018:Right lumpectomy: IDC with DCIS, 0.6 cm, grade 1, 0/2 lymph nodes negative, T1 b N0 stage Ia, ER 100%, PR 95%, HER-2 negative ratio 1.24 Genetic testing negative  Treatment plan: 1. Adjuvant radiation:Patient previously discussed the pros and cons of adjuvant radiation and decided that she does not want to pursue radiation because her tumor is very favorable and she is over the age of 69. 2.Adjuvant antiestrogen therapywith letrozole 2.5 mg daily x5 to 7 years started 02/27/2018.    Letrozole toxicities: 1.  Joint and muscle stiffness that are bearable 2.  Hair thinning 3.  Splitting of nails  I instructed   her that she could take biotin over-the-counter vitamin for hair and nails. I also instructed her to stop calcium because of serum calcium of 10.8  Return to clinic in 1 year for follow-up    No orders of the defined types were placed in this encounter.  The patient has a good understanding of the overall plan. she agrees with it. she will call with any problems that may develop before the next visit here.   Harriette Ohara, MD 11/30/18

## 2019-03-15 DIAGNOSIS — C569 Malignant neoplasm of unspecified ovary: Secondary | ICD-10-CM | POA: Diagnosis not present

## 2019-03-15 DIAGNOSIS — R634 Abnormal weight loss: Secondary | ICD-10-CM | POA: Diagnosis not present

## 2019-03-15 DIAGNOSIS — Z853 Personal history of malignant neoplasm of breast: Secondary | ICD-10-CM | POA: Diagnosis not present

## 2019-03-15 DIAGNOSIS — R921 Mammographic calcification found on diagnostic imaging of breast: Secondary | ICD-10-CM | POA: Diagnosis not present

## 2019-03-15 DIAGNOSIS — M8589 Other specified disorders of bone density and structure, multiple sites: Secondary | ICD-10-CM | POA: Diagnosis not present

## 2019-03-22 DIAGNOSIS — R921 Mammographic calcification found on diagnostic imaging of breast: Secondary | ICD-10-CM | POA: Diagnosis not present

## 2019-03-27 IMAGING — US US ABDOMEN LIMITED
1 series · 14 of 25 positions shown · non-contrast
Comparison: None.

CLINICAL DATA: 3 months ago episode of right upper quadrant. Recent
pain approximately diagnosis of breast cancer.

EXAM:
ULTRASOUND ABDOMEN LIMITED RIGHT UPPER QUADRANT

[Series 1: us abdomen limited · 0.23mm/px · 14 of 46 slices shown]
[im 1/46]
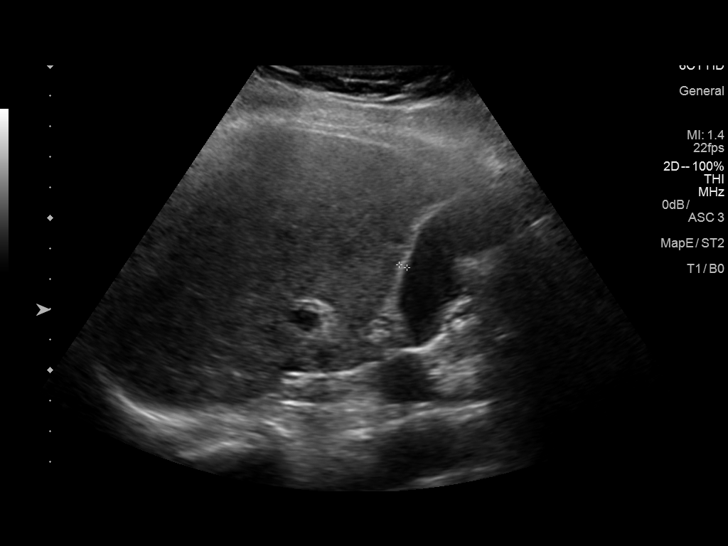
[im 4/46]
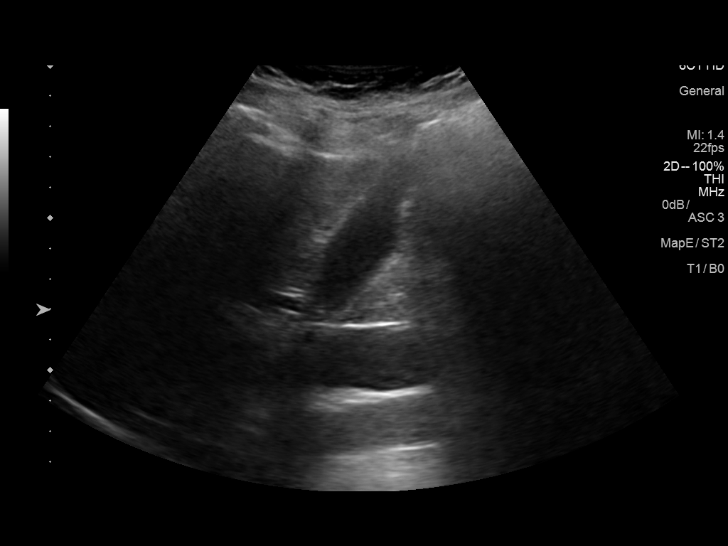
[im 8/46]
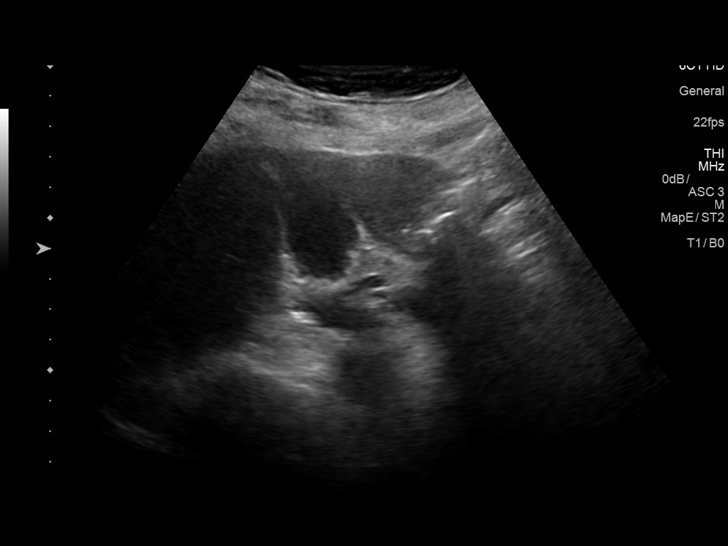
[im 12/46]
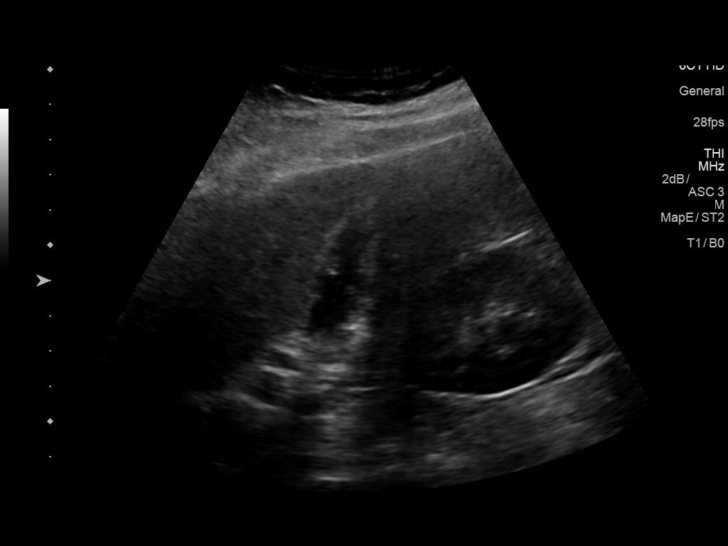
[im 16/46]
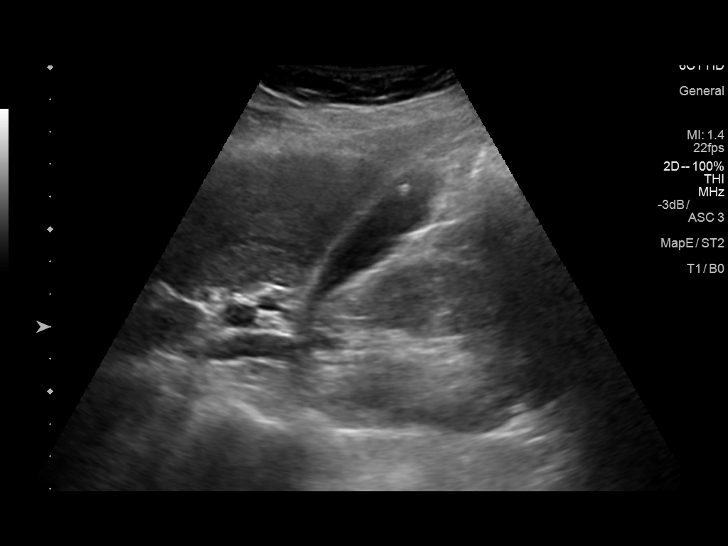
[im 17/46]
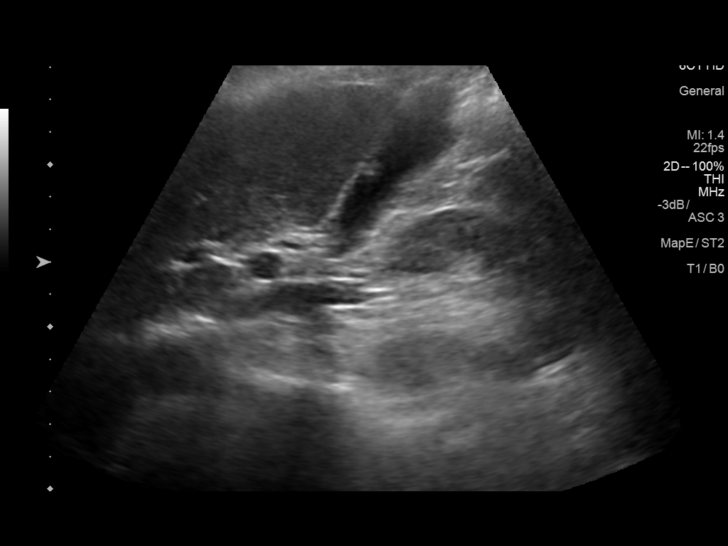
[im 21/46]
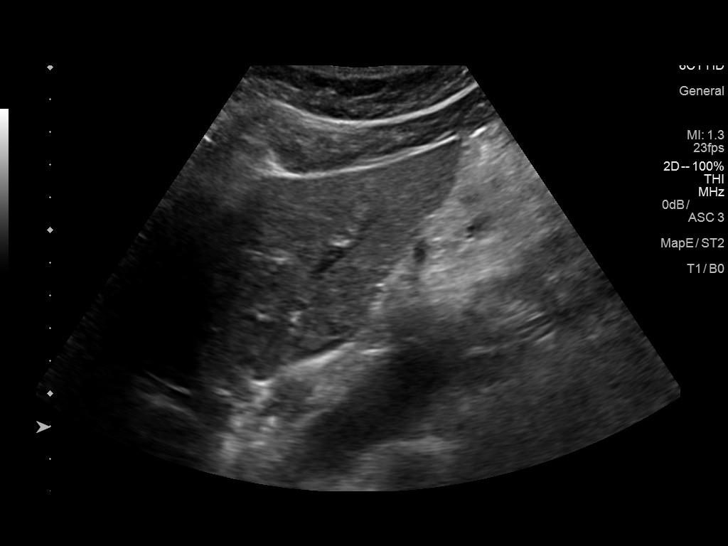
[im 25/46]
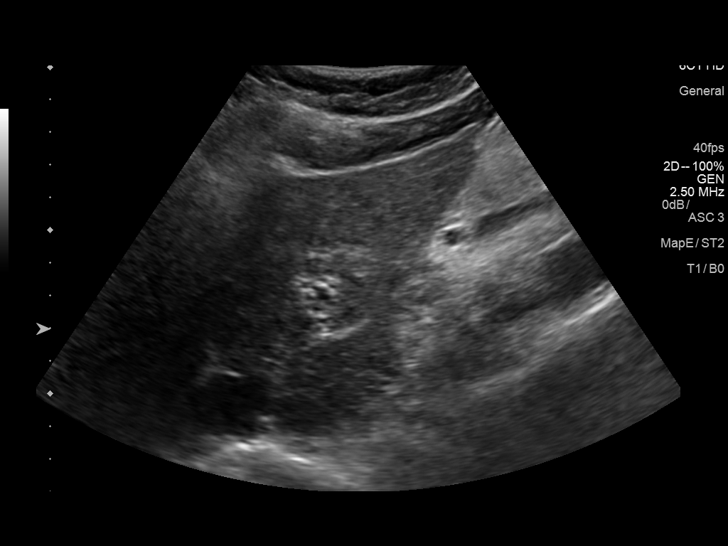
[im 29/46]
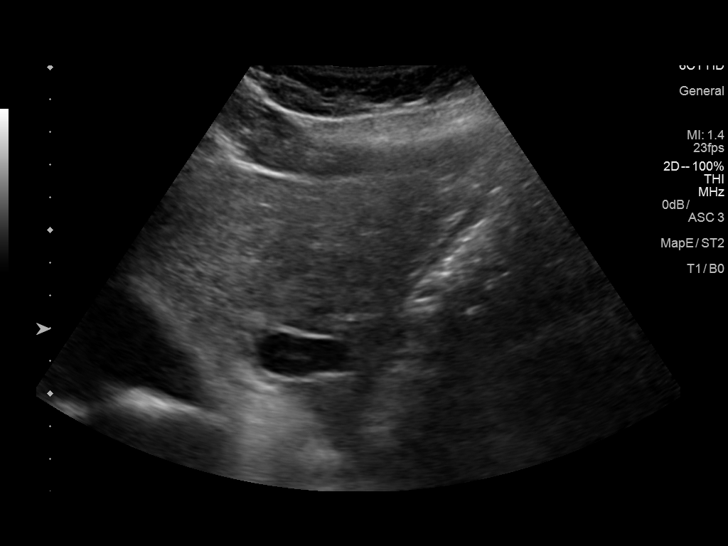
[im 31/46]
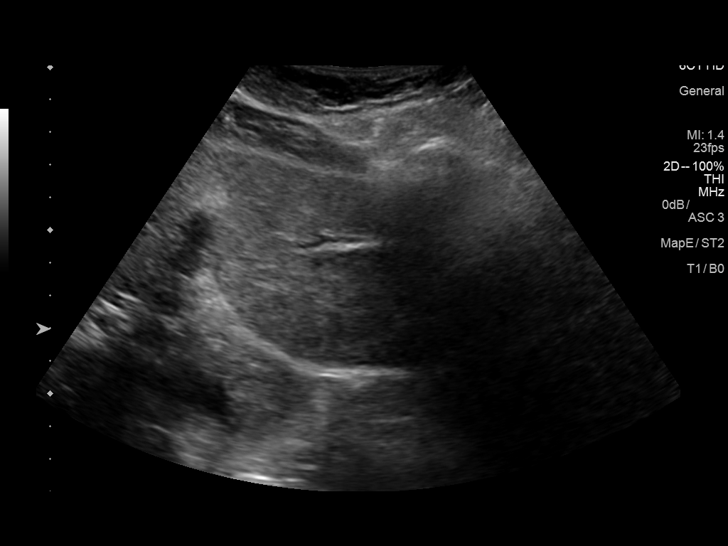
[im 34/46]
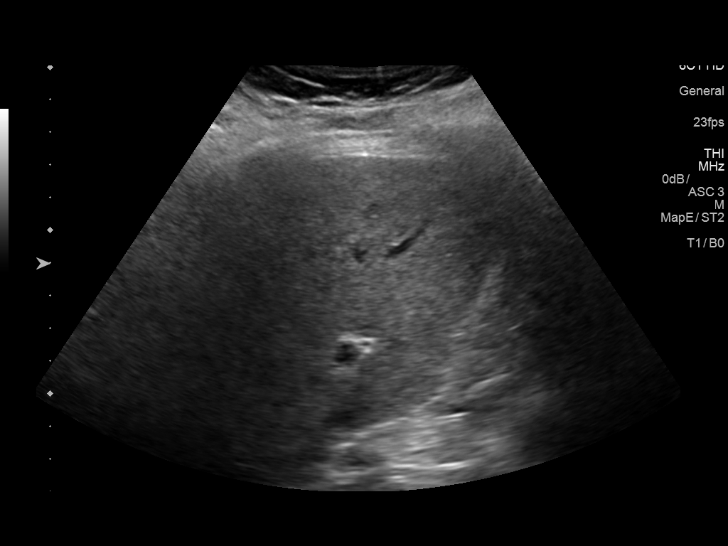
[im 38/46]
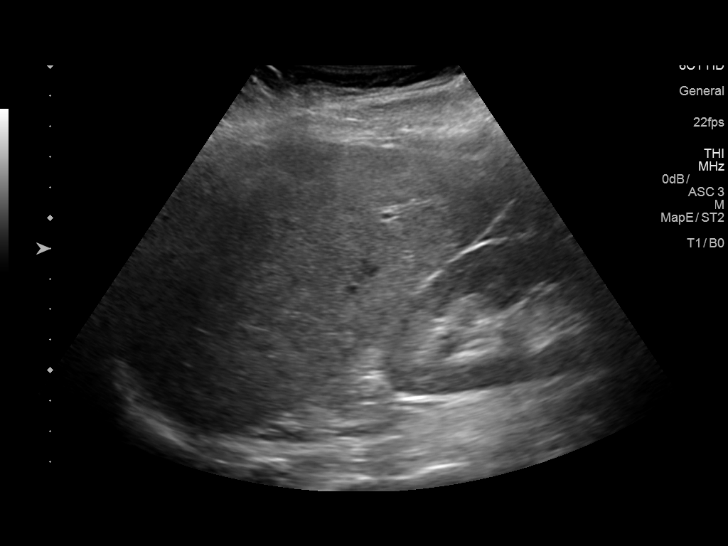
[im 42/46]
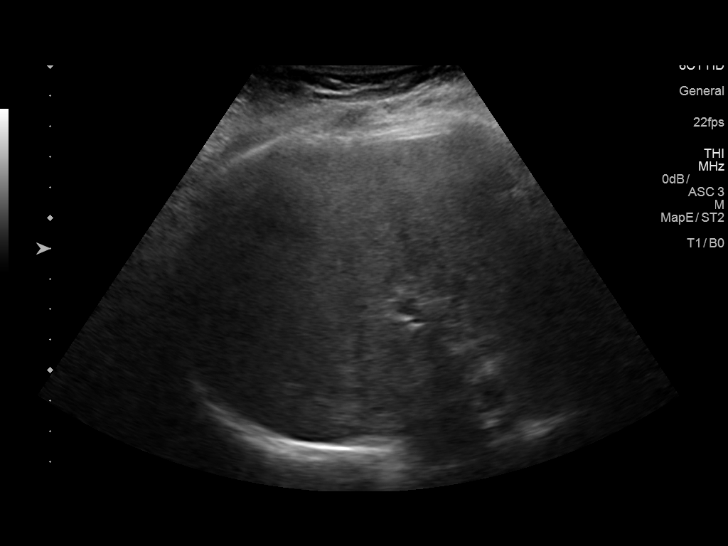
[im 46/46]
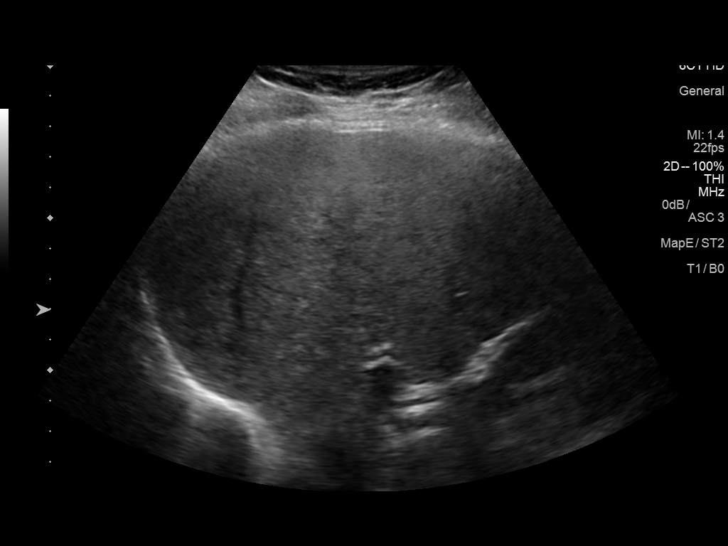

[14 of 25 positions shown; findings below may reference images not displayed]

FINDINGS: Gallbladder:

A 0.6 cm focus of intermediate echogenicity without posterior
acoustic shadowing is seen along the anterior gallbladder wall
likely due to a large polyp. Acute tiny stones are identified. No
evidence of cholecystitis.

Common bile duct:

Diameter: 0.6 cm

Liver:

A 3.1 x 1.4 x 2.1 cm hypoechoic lesion with increased through
transmission and a few internal echoes is most compatible with a
cyst containing some debris. Echogenicity is somewhat increased. No
intrahepatic biliary ductal dilatation. Portal vein is patent on
color Doppler imaging with normal direction of blood flow towards
the liver.
IMPRESSION: 0.6 cm gallbladder polyp and a few small gallstones without evidence
of cholecystitis.

3.1 cm cyst in the liver appears to contain some debris.

Fatty infiltration of the liver.

## 2019-05-10 DIAGNOSIS — D171 Benign lipomatous neoplasm of skin and subcutaneous tissue of trunk: Secondary | ICD-10-CM | POA: Diagnosis not present

## 2019-05-20 DIAGNOSIS — E6609 Other obesity due to excess calories: Secondary | ICD-10-CM | POA: Diagnosis not present

## 2019-05-20 DIAGNOSIS — Z1159 Encounter for screening for other viral diseases: Secondary | ICD-10-CM | POA: Diagnosis not present

## 2019-05-20 DIAGNOSIS — L237 Allergic contact dermatitis due to plants, except food: Secondary | ICD-10-CM | POA: Diagnosis not present

## 2019-05-20 DIAGNOSIS — G43909 Migraine, unspecified, not intractable, without status migrainosus: Secondary | ICD-10-CM | POA: Diagnosis not present

## 2019-05-20 DIAGNOSIS — E785 Hyperlipidemia, unspecified: Secondary | ICD-10-CM | POA: Diagnosis not present

## 2019-05-20 DIAGNOSIS — I10 Essential (primary) hypertension: Secondary | ICD-10-CM | POA: Diagnosis not present

## 2019-05-20 DIAGNOSIS — Z0000 Encounter for general adult medical examination without abnormal findings: Secondary | ICD-10-CM | POA: Diagnosis not present

## 2019-05-20 DIAGNOSIS — M8580 Other specified disorders of bone density and structure, unspecified site: Secondary | ICD-10-CM | POA: Diagnosis not present

## 2019-05-20 DIAGNOSIS — E559 Vitamin D deficiency, unspecified: Secondary | ICD-10-CM | POA: Diagnosis not present

## 2019-05-20 DIAGNOSIS — N952 Postmenopausal atrophic vaginitis: Secondary | ICD-10-CM | POA: Diagnosis not present

## 2019-05-20 DIAGNOSIS — R7303 Prediabetes: Secondary | ICD-10-CM | POA: Diagnosis not present

## 2019-05-20 DIAGNOSIS — C50311 Malignant neoplasm of lower-inner quadrant of right female breast: Secondary | ICD-10-CM | POA: Diagnosis not present

## 2019-06-22 DIAGNOSIS — Z1159 Encounter for screening for other viral diseases: Secondary | ICD-10-CM | POA: Diagnosis not present

## 2019-06-22 DIAGNOSIS — R7303 Prediabetes: Secondary | ICD-10-CM | POA: Diagnosis not present

## 2019-06-22 DIAGNOSIS — I10 Essential (primary) hypertension: Secondary | ICD-10-CM | POA: Diagnosis not present

## 2019-06-22 DIAGNOSIS — E559 Vitamin D deficiency, unspecified: Secondary | ICD-10-CM | POA: Diagnosis not present

## 2019-06-22 DIAGNOSIS — E785 Hyperlipidemia, unspecified: Secondary | ICD-10-CM | POA: Diagnosis not present

## 2019-06-23 DIAGNOSIS — Z1211 Encounter for screening for malignant neoplasm of colon: Secondary | ICD-10-CM | POA: Diagnosis not present

## 2019-06-25 DIAGNOSIS — Z23 Encounter for immunization: Secondary | ICD-10-CM | POA: Diagnosis not present

## 2019-08-24 DIAGNOSIS — M65311 Trigger thumb, right thumb: Secondary | ICD-10-CM | POA: Diagnosis not present

## 2019-10-12 ENCOUNTER — Encounter: Payer: Self-pay | Admitting: Sports Medicine

## 2019-10-12 ENCOUNTER — Ambulatory Visit (INDEPENDENT_AMBULATORY_CARE_PROVIDER_SITE_OTHER): Payer: Medicare Other | Admitting: Sports Medicine

## 2019-10-12 ENCOUNTER — Other Ambulatory Visit: Payer: Self-pay

## 2019-10-12 VITALS — BP 136/82 | Ht 62.5 in | Wt 182.0 lb

## 2019-10-12 DIAGNOSIS — M65311 Trigger thumb, right thumb: Secondary | ICD-10-CM | POA: Diagnosis not present

## 2019-10-12 MED ORDER — METHYLPREDNISOLONE ACETATE 40 MG/ML IJ SUSP
20.0000 mg | Freq: Once | INTRAMUSCULAR | Status: AC
  Administered 2019-10-12: 12:00:00 20 mg via INTRA_ARTICULAR

## 2019-10-12 NOTE — Progress Notes (Signed)
PCP: Harlan Stains, MD  Subjective:   HPI: Patient is a 74 y.o. female here for evaluation of right trigger thumb.  Patient notes her symptoms been present for the last several months.  She denies any specific injury or trauma that preceded symptom onset.  She notes her thumb gets stuck and occasionally she will need to use her other hand to unstick it.  She saw Dr. Layne Benton for this problem who referred her here for ultrasound-guided injection.  Patient notes occasionally she will get triggering of her other fingers however these are not as bad.  She has been wrapping Band-Aids around her fingers at night which has not significantly improved her symptoms.  She is also been taking over-the-counter anti-inflammatories which minimally helped her symptoms.  She denies any numbness or tingling in her hands.   Review of Systems: See HPI above.  Past Medical History:  Diagnosis Date  . Cancer ([place]) 01/2018   right breast cancer  . Cholelithiasis   . Family history of ovarian cancer   . Hypercholesterolemia   . Hypertension     Current Outpatient Medications on File Prior to Visit  Medication Sig Dispense Refill  . alendronate (FOSAMAX) 70 MG tablet once a week.     . Black Pepper-Turmeric 3-500 MG CAPS Take 500 mg by mouth 2 (two) times daily.    . Calcium Carbonate-Vit D-Min (CALCIUM 600+D PLUS MINERALS) 600-400 MG-UNIT CHEW Chew 1 each by mouth daily.    . cholecalciferol (VITAMIN D) 1000 units tablet Take 2,000 Units by mouth daily.    Marland Kitchen letrozole (FEMARA) 2.5 MG tablet Take 1 tablet (2.5 mg total) by mouth daily. 90 tablet 3  . meloxicam (MOBIC) 7.5 MG tablet Take 7.5 mg by mouth daily.    . Multiple Vitamin (MULTIVITAMIN) tablet Take 1 tablet by mouth daily.    Marland Kitchen olmesartan-hydrochlorothiazide (BENICAR HCT) 40-25 MG tablet     . simvastatin (ZOCOR) 40 MG tablet Take 40 mg by mouth daily.   2  . aspirin EC 81 MG tablet Take 1 tablet (81 mg total) by mouth daily. (Patient not taking:  Reported on 10/12/2019)    . losartan-hydrochlorothiazide (HYZAAR) 100-25 MG tablet Take 1 tablet by mouth daily.      No current facility-administered medications on file prior to visit.    Past Surgical History:  Procedure Laterality Date  . BREAST LUMPECTOMY WITH RADIOACTIVE SEED AND SENTINEL LYMPH NODE BIOPSY Right 02/20/2018   Procedure: RIGHT BREAST LUMPECTOMY WITH RADIOACTIVE SEED AND RIGHT AXILLARY SENTINEL LYMPH NODE BIOPSY;  Surgeon: Excell Seltzer, MD;  Location: [facility];  Service: General;  Laterality: Right;  . CESAREAN SECTION    . CHOLECYSTECTOMY N/A 05/08/2018   Procedure: LAPAROSCOPIC CHOLECYSTECTOMY WITH INTRAOPERATIVE CHOLANGIOGRAM;  Surgeon: Excell Seltzer, MD;  Location: [facility];  Service: General;  Laterality: N/A;  . HERNIA REPAIR Right    IHR    Allergies  Allergen Reactions  . Tramadol Itching  . Penicillin G Rash    Has patient had a PCN reaction causing immediate rash, facial/tongue/throat swelling, SOB or lightheadedness with hypotension: unkn Has patient had a PCN reaction causing severe rash involving mucus membranes or skin necrosis:  unkn Has patient had a PCN reaction that required hospitalization: unkn  Has patient had a PCN reaction occurring within the last 10 years: no  If all of the above answers are "NO", then may proceed with Cephalosporin use.     Social History   Socioeconomic History  . Marital  status: Married    Spouse name: Richardson Landry  . Number of children: 3  . Years of education: Not on file  . Highest education level: Bachelor's degree (e.g., BA, AB, BS)  Occupational History  . Occupation: Sales promotion account executive  Tobacco Use  . Smoking status: Never Smoker  . Smokeless tobacco: Never Used  Substance and Sexual Activity  . Alcohol use: Yes    Comment: social  . Drug use: Never  . Sexual activity: Not on file  Other Topics Concern  . Not on file  Social History Narrative  . Not on file   Social Determinants of  Health   Financial Resource Strain:   . Difficulty of Paying Living Expenses: Not on file  Food Insecurity:   . Worried About Charity fundraiser in the Last Year: Not on file  . Ran Out of Food in the Last Year: Not on file  Transportation Needs:   . Lack of Transportation (Medical): Not on file  . Lack of Transportation (Non-Medical): Not on file  Physical Activity:   . Days of Exercise per Week: Not on file  . Minutes of Exercise per Session: Not on file  Stress:   . Feeling of Stress : Not on file  Social Connections:   . Frequency of Communication with Friends and Family: Not on file  . Frequency of Social Gatherings with Friends and Family: Not on file  . Attends Religious Services: Not on file  . Active Member of Clubs or Organizations: Not on file  . Attends Archivist Meetings: Not on file  . Marital Status: Not on file  Intimate Partner Violence:   . Fear of Current or Ex-Partner: Not on file  . Emotionally Abused: Not on file  . Physically Abused: Not on file  . Sexually Abused: Not on file    Family History  Problem Relation Age of Onset  . Uterine cancer Mother        found some cancer cells in uterus - was told it was 'hormone induced'  . Ovarian cancer Sister 5       caught early (found incidentially during surgery), had chemo  . Leukemia Brother 5  . Heart disease Maternal Grandmother 65        Objective:  Physical Exam: There were no vitals taken for this visit. Gen: NAD, comfortable in exam room Lungs: Breathing comfortably on room air Hand/wrist Exam Right -Inspection: Slight deformity to the [facility] joint consistent with DJD -Palpation: Tenderness palpation over the A1 pulley of right thumb.  Patient also with tenderness over the [facility] joint of her thumb -ROM: Normal range of motion in the thumb with flexion and extension.  Triggering was evident upon range of motion testing -Limb neurovascularly intact  Contralateral  Hand/wrist -Inspection: Subluxation of the CMC joint consistent with DJD -Palpation: distal radius: non-tender; Distal ulna: non-tender; scaphoid: non-tender -ROM: Normal range of motion in all fingers -Limb neurovascularly intact    Assessment & Plan:  Patient is a 74 y.o. female here for right trigger thumb  1.  Right trigger thumb -Patient is referred here by Dr. Layne Benton for ultrasound-guided trigger thumb injection -Consent was obtained for ultrasound-guided trigger thumb injection.  Timeout was performed prior to the injection.  20 mg of Depo-Medrol and half cc of lidocaine were injected using ultrasound guidance and sterile technique into the A1 pulley system.  Patient tolerated the injection well, there no complications -Patient was advised that she may wrap to Band-Aids around  her IP joint and her thumb at night to prevent triggering -Patient may use over-the-counter anti-inflammatories as needed for pain  Patient will follow up in 1 month for repeat evaluation of her thumb.  At that time if she still having symptoms we will consider second corticosteroid injection  At her next visit patient would also like to discuss right shoulder pain.  She previously had x-rays done at [facility] which she will bring with her on a disc to her next visit  Patient seen and evaluated with the sports medicine fellow.  I agree with the above plan of care.  Patient's trigger thumb was injected today under ultrasound.  Patient tolerated this without difficulty.  She will follow up in 1 month.  We could consider a repeat injection if she gets a limited response from today's injection.  Of note, she was also complaining of some right shoulder pain.  This has been thoroughly evaluated at [facility].  I was able to review Dr. Kathrin Penner notes.  Review of those notes shows some mild AC DJD on x-ray.  Brief exam today showed her rotator cuff strength to be 5/5 but if her shoulder pain persists  we could consider merits of a cortisone injection.  We will reevaluate this again in 4 weeks at follow-up.

## 2019-10-12 NOTE — Patient Instructions (Signed)
The pain in your thumb is caused by a trigger thumb.  This is where the tendon gets caught within the tendon sheath causing the thumb to stick when you move it -The steroid injection given to you today has about a 50% chance of completely resolving her symptoms. -If your symptoms do not resolve in 1 month we can repeat the injection second time.  After this if you are still having symptoms we may need to consider a surgical release for your trigger thumb -You may wrap two Band-Aids around the joint in your thumb at nighttime to help prevent triggering  We will see you back in 1 month for reevaluation of your trigger thumb.  We can also evaluate your shoulder at that time.  Prior to this visit please get a copy of the x-rays done at [facility] on a disc and bring these with you

## 2019-10-31 ENCOUNTER — Ambulatory Visit: Payer: Medicare Other

## 2019-11-05 ENCOUNTER — Ambulatory Visit: Payer: Medicare Other

## 2019-11-06 ENCOUNTER — Ambulatory Visit: Payer: Medicare Other

## 2019-11-09 ENCOUNTER — Ambulatory Visit (INDEPENDENT_AMBULATORY_CARE_PROVIDER_SITE_OTHER): Payer: Medicare Other | Admitting: Sports Medicine

## 2019-11-09 ENCOUNTER — Other Ambulatory Visit: Payer: Self-pay

## 2019-11-09 DIAGNOSIS — G8929 Other chronic pain: Secondary | ICD-10-CM

## 2019-11-09 DIAGNOSIS — M65311 Trigger thumb, right thumb: Secondary | ICD-10-CM | POA: Diagnosis not present

## 2019-11-09 DIAGNOSIS — M25511 Pain in right shoulder: Secondary | ICD-10-CM | POA: Insufficient documentation

## 2019-11-09 NOTE — Assessment & Plan Note (Signed)
Patient received injection on 10/12/2019 and is no longer having symptoms of trigger thumb.  She still has some mild discomfort over first [facility] which may be consistent with arthritis.  Patient states this is well controlled with Voltaren gel and does not interfere with her daily life.  She is very happy with the results and will return on a as needed basis for this problem.

## 2019-11-09 NOTE — Progress Notes (Addendum)
HPI  CC: 74 yo female here for follow up of R trigger thumb, R should pain.  R trigger thumb Patient received injection for trigger thumb on 10/12/2019.  Patient states that since then she has not had any further trouble with her finger locking or popping.  She states that her pain has mostly resolved.  She states she only gets some mild irritation in the base of her thumb nail.  R shoulder pain Patient reports that she has had shoulder pain for a couple of years.  She was seen at [facility] where she had x-rays done about 2 years ago, and she was told that she has some arthritis.  She states that she has been doing some range of motion exercises as well as some exercises given to her by the doctors at that office which had kind of help.  Patient reports that she still gets pain mostly while cooking in the kitchen.  She reports that the pain happens when she is stirring a pot with her raised shoulder.  She is here because she wants to help build strength in her arm so that she can delay needing other interventions with her shoulder.  She states that she has tried meloxicam on and off throughout the years for 2 weeks at a time.  She has also tried Tylenol periodically as well as Voltaren gel.  She states that she has not any in excess of plain today she just wants to prevent any further damage to her shoulder.  See HPI and/or previous note for associated ROS.  Objective: BP 132/74   Ht 5' 2.5" (1.588 m)   Wt 183 lb (83 kg)   BMI 32.94 kg/m  Gen: R-Hand Dominant. NAD, well groomed, a/o x3, normal affect.  CV: Well-perfused. Warm.  Resp: Non-labored.  Neuro: Sensation intact throughout. No gross coordination deficits.  Shoulder, R: Inspection reveals no abnormalities, atrophy or asymmetry. Palpation normal with no tenderness over AC joint or bicipital groove. ROM is full in all planes. Rotator cuff strength normal throughout. No signs of impingement with negative Neer and Hawkin's  tests, empty can sign. Speeds and Yergason's tests normal. No labral pathology noted with negative Obrien's, good stability. No painful arc and no drop arm sign. R thumb: No deformity, swelling or redness noted. Mild TTP over 1st CMC, normal A1 pulley of thumb. No TTP over 1st dorsal compartment, carpal tunnel tenderness. FROM with 5/5 strengthin Abd/Add/Ext/Flex of digits and wrist.  NVI distally. Negative tinels, phalens, Finkelstein.  Assessment and plan:  Trigger thumb of right hand Patient received injection on 10/12/2019 and is no longer having symptoms of trigger thumb.  She still has some mild discomfort over first [facility] which may be consistent with arthritis.  Patient states this is well controlled with Voltaren gel and does not interfere with her daily life.  She is very happy with the results and will return on a as needed basis for this problem.  Right shoulder pain Chronic right shoulder pain for the past couple of years.  No signs of rotator cuff injury during exam.  Patient with no weakness on exam.  X-rays reviewed from outside location show moderate AC DJD and mild glenohumeral DJD. -Patient given Job exercises as well as exercises for scapular stabilization in order to prevent worsening of OA.  Advised to avoid any excessive weight lifting overhead. -Patient to continue with naproxen and Tylenol as needed for pain, may continue with ice and heat therapy as needed -Patient to return  as needed   Martinique Kaulder Zahner, DO PGY-3, [facility]  11/09/2019 3:26 PM  Patient seen and evaluated with the resident.  I agree with the above plan of care.  Patient's trigger thumb is much better after recent cortisone injection.  She is shown a home exercise program for her right shoulder.  Follow-up as needed.

## 2019-11-09 NOTE — Assessment & Plan Note (Signed)
Chronic right shoulder pain for the past couple of years.  No signs of rotator cuff injury during exam.  Patient with no weakness on exam.  X-rays reviewed from outside location show moderate AC DJD and mild glenohumeral DJD. -Patient given Job exercises as well as exercises for scapular stabilization in order to prevent worsening of OA.  Advised to avoid any excessive weight lifting overhead. -Patient to continue with naproxen and Tylenol as needed for pain, may continue with ice and heat therapy as needed -Patient to return as needed

## 2019-11-19 DIAGNOSIS — E785 Hyperlipidemia, unspecified: Secondary | ICD-10-CM | POA: Diagnosis not present

## 2019-11-19 DIAGNOSIS — I10 Essential (primary) hypertension: Secondary | ICD-10-CM | POA: Diagnosis not present

## 2019-11-19 DIAGNOSIS — M8588 Other specified disorders of bone density and structure, other site: Secondary | ICD-10-CM | POA: Diagnosis not present

## 2019-11-19 DIAGNOSIS — C50311 Malignant neoplasm of lower-inner quadrant of right female breast: Secondary | ICD-10-CM | POA: Diagnosis not present

## 2019-11-19 DIAGNOSIS — R7303 Prediabetes: Secondary | ICD-10-CM | POA: Diagnosis not present

## 2019-11-19 DIAGNOSIS — E6609 Other obesity due to excess calories: Secondary | ICD-10-CM | POA: Diagnosis not present

## 2019-11-22 DIAGNOSIS — H25813 Combined forms of age-related cataract, bilateral: Secondary | ICD-10-CM | POA: Diagnosis not present

## 2019-11-26 ENCOUNTER — Ambulatory Visit: Payer: Medicare Other

## 2019-11-29 NOTE — Progress Notes (Signed)
Patient Care Team: Harlan Stains, MD as PCP - General (Family Medicine) Excell Seltzer, MD (Inactive) as Consulting Physician (General Surgery) Nicholas Lose, MD as Consulting Physician (Hematology and Oncology) Gery Pray, MD as Consulting Physician (Radiation Oncology)  DIAGNOSIS:    ICD-10-CM   1. Malignant neoplasm of upper-inner quadrant of right breast in female, estrogen receptor positive ([place])  C50.211    Z17.0     SUMMARY OF ONCOLOGIC HISTORY: Oncology History  Malignant neoplasm of upper-inner quadrant of right breast in female, estrogen receptor positive ([place])  01/30/2018 Initial Diagnosis   Screening detected right breast mass 0.5 cm at 1 o'clock position 9 cm from nipple, axilla negative biopsy: Invasive ductal carcinoma and a papillary lesion grade 1-2, ER PR positive HER-2 negative Ki-67 20%, T1 a N0 stage I a clinical stage AJCC 8   02/13/2018 Genetic Testing   The Common Hereditary Cancers Panel + Myelodysplastic Syndrome/Leukemia Panel was ordered (54 genes) The following genes were evaluated for sequence changes and exonic deletions/duplications: APC, ATM, AXIN2, BARD1, BLM, BMPR1A, BRCA1, BRCA2, BRIP1, CDH1, CDK4, CDKN2A (p14ARF), CDKN2A (p16INK4a), CEBPA, CHEK2, CTNNA1, DICER1, EPCAM*, GATA2, GREM1*, HRAS, KIT, MEN1, MLH1, MSH2, MSH3, MSH6, MUTYH, NBN, NF1, PALB2, PDGFRA, PMS2, POLD1, POLE, PTEN, RAD50, RAD51C, RAD51D, RUNX1, SDHB, SDHC, SDHD, SMAD4, SMARCA4, STK11, TERC, TERT, TP53, TSC1, TSC2, VHL The following genes were evaluated for sequence changes only:HOXB13*, NTHL1*, SDHA  Results: Negative, no pathogenic variants identified.  The date of this test report is 02/13/2018.    02/20/2018 Surgery   Right lumpectomy: IDC with DCIS, 0.6 cm, grade 1, 0/2 lymph nodes negative, T1 b N0 stage Ia, ER 100%, PR 95%, HER-2 negative ratio 1.24   03/29/2018 -  Anti-estrogen oral therapy   Letrozole 2.5 mg daily     CHIEF COMPLIANT: Follow-up of right breast cancer  on letrozole therapy  INTERVAL HISTORY: Margaret Hunter is a 74 y.o. with above-mentioned history of right breast cancer treated with lumpectomy and who is currently on antiestrogen therapy with letrozole. Bone density scan on 03/15/19 showed osteopenia with a T-score of -2.0 at the right femoral neck. Mammogram on 03/15/19 showed a new 4.5cm group of calcifications at the right breast lumpectomy site. Diagnostic mammogram showed them to be benign skin calcifications. She presents to the clinic today for annual follow-up.   ALLERGIES:  is allergic to tramadol and penicillin g.  MEDICATIONS:  Current Outpatient Medications  Medication Sig Dispense Refill  . alendronate (FOSAMAX) 70 MG tablet once a week.     Marland Kitchen aspirin EC 81 MG tablet Take 1 tablet (81 mg total) by mouth daily. (Patient not taking: Reported on 10/12/2019)    . Black Pepper-Turmeric 3-500 MG CAPS Take 500 mg by mouth 2 (two) times daily.    . Calcium Carbonate-Vit D-Min (CALCIUM 600+D PLUS MINERALS) 600-400 MG-UNIT CHEW Chew 1 each by mouth daily.    . cholecalciferol (VITAMIN D) 1000 units tablet Take 2,000 Units by mouth daily.    Marland Kitchen letrozole (FEMARA) 2.5 MG tablet Take 1 tablet (2.5 mg total) by mouth daily. 90 tablet 3  . losartan-hydrochlorothiazide (HYZAAR) 100-25 MG tablet Take 1 tablet by mouth daily.     . meloxicam (MOBIC) 7.5 MG tablet Take 7.5 mg by mouth daily.    . Multiple Vitamin (MULTIVITAMIN) tablet Take 1 tablet by mouth daily.    Marland Kitchen olmesartan-hydrochlorothiazide (BENICAR HCT) 40-25 MG tablet     . simvastatin (ZOCOR) 40 MG tablet Take 40 mg by mouth daily.  2   No current facility-administered medications for this visit.    PHYSICAL EXAMINATION: ECOG PERFORMANCE STATUS: 1 - Symptomatic but completely ambulatory  There were no vitals filed for this visit. There were no vitals filed for this visit.  BREAST: No palpable masses or nodules in either right or left breasts. No palpable axillary supraclavicular or  infraclavicular adenopathy no breast tenderness or nipple discharge. (exam performed in the presence of a chaperone)  LABORATORY DATA:  I have reviewed the data as listed CMP Latest Ref Rng & Units 05/01/2018 02/04/2018  Glucose 70 - 99 mg/dL 118(H) 103  BUN 8 - 23 mg/dL 20 19  Creatinine 0.44 - 1.00 mg/dL 0.66 0.77  Sodium 135 - 145 mmol/L 140 141  Potassium 3.5 - 5.1 mmol/L 3.7 3.8  Chloride 98 - 111 mmol/L 104 106  CO2 22 - 32 mmol/L 27 28  Calcium 8.9 - 10.3 mg/dL 10.4(H) 10.7(H)  Total Protein 6.4 - 8.3 g/dL - 7.0  Total Bilirubin 0.2 - 1.2 mg/dL - 0.5  Alkaline Phos 40 - 150 U/L - 101  AST 5 - 34 U/L - 14  ALT 0 - 55 U/L - 16    Lab Results  Component Value Date   WBC 8.2 02/04/2018   HGB 13.2 02/04/2018   HCT 40.4 02/04/2018   MCV 86.8 02/04/2018   PLT 252 02/04/2018   NEUTROABS 5.7 02/04/2018    ASSESSMENT & PLAN:  Malignant neoplasm of upper-inner quadrant of right breast in female, estrogen receptor positive ([place]) 02/20/2018:Right lumpectomy: IDC with DCIS, 0.6 cm, grade 1, 0/2 lymph nodes negative, T1 b N0 stage Ia, ER 100%, PR 95%, HER-2 negative ratio 1.24 Genetic testing negative  Treatment plan: 1. Adjuvant radiation:Based on PRIME-2 study results, decided not to do radiation. 2.Adjuvant antiestrogen therapywith letrozole 2.5 mg daily x5 to 7 yearsstarted 02/27/2018.   Letrozole toxicities: 1.  Joint and muscle stiffness that are bearable 2.  Hair thinning 3.  Splitting of nails   Breast cancer surveillance: 1.  Breast exam 11/30/2019: Benign 2.  Mammogram and ultrasound June 2020: New round calcifications at the lumpectomy site felt to be benign.  Return to clinic in 1 year for follow-up    No orders of the defined types were placed in this encounter.  The patient has a good understanding of the overall plan. she agrees with it. she will call with any problems that may develop before the next visit here.  Total time spent: 20 mins including  face to face time and time spent for planning, charting and coordination of care  Nicholas Lose, MD 11/30/2019  I, Cloyde Reams Dorshimer, am acting as scribe for Dr. Nicholas Lose.  I have reviewed the above documentation for accuracy and completeness, and I agree with the above.

## 2019-11-30 ENCOUNTER — Other Ambulatory Visit: Payer: Self-pay

## 2019-11-30 ENCOUNTER — Inpatient Hospital Stay: Payer: Medicare Other | Attending: Hematology and Oncology | Admitting: Hematology and Oncology

## 2019-11-30 DIAGNOSIS — M8580 Other specified disorders of bone density and structure, unspecified site: Secondary | ICD-10-CM | POA: Diagnosis not present

## 2019-11-30 DIAGNOSIS — C50211 Malignant neoplasm of upper-inner quadrant of right female breast: Secondary | ICD-10-CM | POA: Insufficient documentation

## 2019-11-30 DIAGNOSIS — Z79899 Other long term (current) drug therapy: Secondary | ICD-10-CM | POA: Diagnosis not present

## 2019-11-30 DIAGNOSIS — Z170 Estrogen receptor positive status [ER+]: Secondary | ICD-10-CM | POA: Diagnosis not present

## 2019-11-30 DIAGNOSIS — Z888 Allergy status to other drugs, medicaments and biological substances status: Secondary | ICD-10-CM | POA: Insufficient documentation

## 2019-11-30 DIAGNOSIS — Z880 Allergy status to penicillin: Secondary | ICD-10-CM | POA: Insufficient documentation

## 2019-11-30 DIAGNOSIS — Z885 Allergy status to narcotic agent status: Secondary | ICD-10-CM | POA: Insufficient documentation

## 2019-11-30 DIAGNOSIS — Z79811 Long term (current) use of aromatase inhibitors: Secondary | ICD-10-CM | POA: Diagnosis not present

## 2019-11-30 MED ORDER — BIOTIN 1000 MCG PO CHEW: 1.0000 | CHEWABLE_TABLET | Freq: Two times a day (BID) | ORAL | Status: DC

## 2019-11-30 MED ORDER — LETROZOLE 2.5 MG PO TABS: 2.5000 mg | ORAL_TABLET | Freq: Every day | ORAL | 3 refills | Status: DC

## 2019-11-30 NOTE — Assessment & Plan Note (Signed)
02/20/2018:Right lumpectomy: IDC with DCIS, 0.6 cm, grade 1, 0/2 lymph nodes negative, T1 b N0 stage Ia, ER 100%, PR 95%, HER-2 negative ratio 1.24 Genetic testing negative  Treatment plan: 1. Adjuvant radiation:Based on PRIME-2 study results, decided not to do radiation. 2.Adjuvant antiestrogen therapywith letrozole 2.5 mg daily x5 to 7 yearsstarted 02/27/2018.   Letrozole toxicities: 1.  Joint and muscle stiffness that are bearable 2.  Hair thinning 3.  Splitting of nails   Breast cancer surveillance: 1.  Breast exam 11/30/2019: Benign 2.  Mammogram and ultrasound June 2020: New round calcifications at the lumpectomy site felt to be benign.  Return to clinic in 1 year for follow-up

## 2019-12-01 ENCOUNTER — Telehealth: Payer: Self-pay | Admitting: Hematology and Oncology

## 2019-12-01 DIAGNOSIS — Z85828 Personal history of other malignant neoplasm of skin: Secondary | ICD-10-CM | POA: Diagnosis not present

## 2019-12-01 DIAGNOSIS — L814 Other melanin hyperpigmentation: Secondary | ICD-10-CM | POA: Diagnosis not present

## 2019-12-01 DIAGNOSIS — L570 Actinic keratosis: Secondary | ICD-10-CM | POA: Diagnosis not present

## 2019-12-01 DIAGNOSIS — D1801 Hemangioma of skin and subcutaneous tissue: Secondary | ICD-10-CM | POA: Diagnosis not present

## 2019-12-01 DIAGNOSIS — L821 Other seborrheic keratosis: Secondary | ICD-10-CM | POA: Diagnosis not present

## 2019-12-01 DIAGNOSIS — D2271 Melanocytic nevi of right lower limb, including hip: Secondary | ICD-10-CM | POA: Diagnosis not present

## 2019-12-01 DIAGNOSIS — L910 Hypertrophic scar: Secondary | ICD-10-CM | POA: Diagnosis not present

## 2019-12-01 DIAGNOSIS — D225 Melanocytic nevi of trunk: Secondary | ICD-10-CM | POA: Diagnosis not present

## 2019-12-01 NOTE — Telephone Encounter (Signed)
I left a message regarding schedule  

## 2020-05-08 ENCOUNTER — Telehealth: Payer: Self-pay | Admitting: Hematology and Oncology

## 2020-05-08 NOTE — Telephone Encounter (Signed)
Rescheduled appointment per 8/5 provider message. Patient is aware of updated appointment date and time.

## 2020-05-22 DIAGNOSIS — E785 Hyperlipidemia, unspecified: Secondary | ICD-10-CM | POA: Diagnosis not present

## 2020-05-22 DIAGNOSIS — R7303 Prediabetes: Secondary | ICD-10-CM | POA: Diagnosis not present

## 2020-05-22 DIAGNOSIS — M8588 Other specified disorders of bone density and structure, other site: Secondary | ICD-10-CM | POA: Diagnosis not present

## 2020-05-22 DIAGNOSIS — Z0000 Encounter for general adult medical examination without abnormal findings: Secondary | ICD-10-CM | POA: Diagnosis not present

## 2020-05-22 DIAGNOSIS — E559 Vitamin D deficiency, unspecified: Secondary | ICD-10-CM | POA: Diagnosis not present

## 2020-05-22 DIAGNOSIS — C50311 Malignant neoplasm of lower-inner quadrant of right female breast: Secondary | ICD-10-CM | POA: Diagnosis not present

## 2020-05-22 DIAGNOSIS — N952 Postmenopausal atrophic vaginitis: Secondary | ICD-10-CM | POA: Diagnosis not present

## 2020-05-22 DIAGNOSIS — L237 Allergic contact dermatitis due to plants, except food: Secondary | ICD-10-CM | POA: Diagnosis not present

## 2020-05-22 DIAGNOSIS — Z1389 Encounter for screening for other disorder: Secondary | ICD-10-CM | POA: Diagnosis not present

## 2020-05-22 DIAGNOSIS — G43909 Migraine, unspecified, not intractable, without status migrainosus: Secondary | ICD-10-CM | POA: Diagnosis not present

## 2020-05-22 DIAGNOSIS — I10 Essential (primary) hypertension: Secondary | ICD-10-CM | POA: Diagnosis not present

## 2020-05-22 DIAGNOSIS — Z1211 Encounter for screening for malignant neoplasm of colon: Secondary | ICD-10-CM | POA: Diagnosis not present

## 2020-05-24 DIAGNOSIS — Z1211 Encounter for screening for malignant neoplasm of colon: Secondary | ICD-10-CM | POA: Diagnosis not present

## 2020-06-07 DIAGNOSIS — Z170 Estrogen receptor positive status [ER+]: Secondary | ICD-10-CM | POA: Diagnosis not present

## 2020-06-07 DIAGNOSIS — C50911 Malignant neoplasm of unspecified site of right female breast: Secondary | ICD-10-CM | POA: Diagnosis not present

## 2020-06-14 DIAGNOSIS — Z23 Encounter for immunization: Secondary | ICD-10-CM | POA: Diagnosis not present

## 2020-06-26 DIAGNOSIS — C50311 Malignant neoplasm of lower-inner quadrant of right female breast: Secondary | ICD-10-CM | POA: Diagnosis not present

## 2020-06-26 DIAGNOSIS — I10 Essential (primary) hypertension: Secondary | ICD-10-CM | POA: Diagnosis not present

## 2020-06-26 DIAGNOSIS — E785 Hyperlipidemia, unspecified: Secondary | ICD-10-CM | POA: Diagnosis not present

## 2020-07-25 DIAGNOSIS — Z23 Encounter for immunization: Secondary | ICD-10-CM | POA: Diagnosis not present

## 2020-09-07 DIAGNOSIS — E785 Hyperlipidemia, unspecified: Secondary | ICD-10-CM | POA: Diagnosis not present

## 2020-09-07 DIAGNOSIS — I10 Essential (primary) hypertension: Secondary | ICD-10-CM | POA: Diagnosis not present

## 2020-09-07 DIAGNOSIS — C50311 Malignant neoplasm of lower-inner quadrant of right female breast: Secondary | ICD-10-CM | POA: Diagnosis not present

## 2020-10-26 ENCOUNTER — Ambulatory Visit (INDEPENDENT_AMBULATORY_CARE_PROVIDER_SITE_OTHER): Payer: Medicare Other | Admitting: Sports Medicine

## 2020-10-26 ENCOUNTER — Ambulatory Visit: Payer: Self-pay

## 2020-10-26 ENCOUNTER — Other Ambulatory Visit: Payer: Self-pay

## 2020-10-26 VITALS — BP 148/64 | Ht 62.0 in | Wt 183.0 lb

## 2020-10-26 DIAGNOSIS — M79644 Pain in right finger(s): Secondary | ICD-10-CM

## 2020-10-26 MED ORDER — METHYLPREDNISOLONE ACETATE 40 MG/ML IJ SUSP
20.0000 mg | Freq: Once | INTRAMUSCULAR | Status: AC
  Administered 2020-10-26: 20 mg via INTRA_ARTICULAR

## 2020-10-26 NOTE — Progress Notes (Signed)
Office Visit Note   Patient: Margaret Hunter           Date of Birth: 1946/02/08           MRN: [id number] Visit Date: 10/26/2020 Requested by: Harlan Stains, MD [address] [place],  [place] [address] PCP: Harlan Stains, MD  Subjective: CC: Right Thumb Pain  HPI: 75 year old female presenting to clinic requesting a right thumb injection.  Patient states that she has a history of right thumb pain, and had injected approximately 1 year ago.  After this previous injection, she said she had complete resolution of her symptoms for nearly 11 months.  She started to notice pain again over the Christmas season, when she was using her hand more heavily on holiday related tasks.  She wants to know if she can have an injection into this area, given her significant improvement previously.  Thumb is not triggering as it did previously, and she states the majority of her pain is located over her first metacarpal phalangeal joint.  She has no other medical concerns today.              ROS:   All other systems were reviewed and are negative.  Objective: Vital Signs: BP (!) 148/64   Ht 5\' 2"  (1.575 m)   Wt 183 lb (83 kg)   BMI 33.47 kg/m   Physical Exam:  General:  Alert and oriented, in no acute distress. Pulm:  Breathing unlabored. Psy:  Normal mood, congruent affect. Skin: Right hand with no bruising, rashes, or erythema.  Overlying skin intact. Right hand with no obvious swelling.  Does have bossing at the [facility] joints bilaterally, L>R.  Tenderness to palpation of the first metacarpal phalangeal joint.  Crepitus and pain with compression of this joint.  Thumb with full strength with resisted flexion, extension, and opposition.  Brisk capillary refill.   Imaging/Procedures: Ultrasound Guided Right Thumb MCP cortisone Injection:  Risks and benefits of procedure discussed, Patient opted to proceed.  Written consent obtained.  Timeout performed.  Skin prepped in a sterile fashion  with betadine before further cleansing with alcohol. Ethyl Chloride was used for topical analgesia.  While gentle distraction was applied, first metacarpophalangeal joint was injected with 0.5cc 1% Lidocaine without epinephrine and 40 mg of methylprednisolone using a 25G, 1.5in needle under ultrasound guidance.   Patient tolerated the injection well with no immediate complications. Aftercare instructions were discussed, and patient was given strict return precautions.    Assessment & Plan: 75 year old female presenting to clinic today requesting right metacarpophalangeal joint injection.  Patient with obvious bony bossing to bilateral CMC joints as well as first MCP joints, suggesting significant arthritis in these areas.  Injection performed as described above, which patient tolerated very well. -Strict return precautions discussed. -Possibility of steroid flare discussed. -If no benefit in 1 week, patient is instructed to return to clinic.  Would consider x-rays at that time.   -Patient expresses understanding, she has no further questions or concerns today.   Patient seen and evaluated with the sports medicine fellow.  I agree with the above plan of care.  Patient does not have any active triggering of her right thumb today.  Pain seems to be centered more around the [facility] joint.  Right CMC joint was injected as above with ultrasound guidance.  If symptoms persist, I will start with getting an x-ray of her thumb and we may consider a repeat injection using a different approach.  Follow-up for ongoing or recalcitrant issues.

## 2020-10-27 ENCOUNTER — Encounter: Payer: Self-pay | Admitting: Sports Medicine

## 2020-10-27 NOTE — Addendum Note (Signed)
Addended by: Jolinda Croak E on: 10/27/2020 09:34 AM   Modules accepted: Orders

## 2020-11-21 DIAGNOSIS — H2513 Age-related nuclear cataract, bilateral: Secondary | ICD-10-CM | POA: Diagnosis not present

## 2020-11-21 DIAGNOSIS — H52203 Unspecified astigmatism, bilateral: Secondary | ICD-10-CM | POA: Diagnosis not present

## 2020-11-21 DIAGNOSIS — H5203 Hypermetropia, bilateral: Secondary | ICD-10-CM | POA: Diagnosis not present

## 2020-11-21 DIAGNOSIS — H25013 Cortical age-related cataract, bilateral: Secondary | ICD-10-CM | POA: Diagnosis not present

## 2020-11-21 DIAGNOSIS — H524 Presbyopia: Secondary | ICD-10-CM | POA: Diagnosis not present

## 2020-11-23 DIAGNOSIS — E785 Hyperlipidemia, unspecified: Secondary | ICD-10-CM | POA: Diagnosis not present

## 2020-11-23 DIAGNOSIS — R7303 Prediabetes: Secondary | ICD-10-CM | POA: Diagnosis not present

## 2020-11-23 DIAGNOSIS — Z23 Encounter for immunization: Secondary | ICD-10-CM | POA: Diagnosis not present

## 2020-11-23 DIAGNOSIS — R002 Palpitations: Secondary | ICD-10-CM | POA: Diagnosis not present

## 2020-11-23 DIAGNOSIS — I10 Essential (primary) hypertension: Secondary | ICD-10-CM | POA: Diagnosis not present

## 2020-11-29 ENCOUNTER — Ambulatory Visit: Payer: Medicare Other | Admitting: Hematology and Oncology

## 2020-11-29 NOTE — Progress Notes (Signed)
Patient Care Team: Laurann Montana, MD as PCP - General (Family Medicine) Glenna Fellows, MD (Inactive) as Consulting Physician (General Surgery) Serena Croissant, MD as Consulting Physician (Hematology and Oncology) Antony Blackbird, MD as Consulting Physician (Radiation Oncology)  DIAGNOSIS:    ICD-10-CM   1. Malignant neoplasm of upper-inner quadrant of right breast in female, estrogen receptor positive ([facility])  C50.211    Z17.0     SUMMARY OF ONCOLOGIC HISTORY: Oncology History  Malignant neoplasm of upper-inner quadrant of right breast in female, estrogen receptor positive ([facility])  01/30/2018 Initial Diagnosis   Screening detected right breast mass 0.5 cm at 1 o'clock position 9 cm from nipple, axilla negative biopsy: Invasive ductal carcinoma and a papillary lesion grade 1-2, ER PR positive HER-2 negative Ki-67 20%, T1 a N0 stage I a clinical stage AJCC 8   02/13/2018 Genetic Testing   The Common Hereditary Cancers Panel + Myelodysplastic Syndrome/Leukemia Panel was ordered (54 genes) The following genes were evaluated for sequence changes and exonic deletions/duplications: APC, ATM, AXIN2, BARD1, BLM, BMPR1A, BRCA1, BRCA2, BRIP1, CDH1, CDK4, CDKN2A (p14ARF), CDKN2A (p16INK4a), CEBPA, CHEK2, CTNNA1, DICER1, EPCAM*, GATA2, GREM1*, HRAS, KIT, MEN1, MLH1, MSH2, MSH3, MSH6, MUTYH, NBN, NF1, PALB2, PDGFRA, PMS2, POLD1, POLE, PTEN, RAD50, RAD51C, RAD51D, RUNX1, SDHB, SDHC, SDHD, SMAD4, SMARCA4, STK11, TERC, TERT, TP53, TSC1, TSC2, VHL The following genes were evaluated for sequence changes only:HOXB13*, NTHL1*, SDHA  Results: Negative, no pathogenic variants identified.  The date of this test report is 02/13/2018.    02/20/2018 Surgery   Right lumpectomy: IDC with DCIS, 0.6 cm, grade 1, 0/2 lymph nodes negative, T1 b N0 stage Ia, ER 100%, PR 95%, HER-2 negative ratio 1.24   03/29/2018 -  Anti-estrogen oral therapy   Letrozole 2.5 mg daily     CHIEF COMPLIANT:  Follow-up of right breast cancer  on letrozole therapy  INTERVAL HISTORY: Margaret Hunter is a 75 y.o. with above-mentioned history of right breast cancer treated with lumpectomy and who is currently on antiestrogen therapy with letrozole. Mammogram on 03/23/20 showed no evidence of malignancy bilaterally. She presents to the clinic today for follow-up.    ALLERGIES:  is allergic to tramadol and penicillin g.  MEDICATIONS:  Current Outpatient Medications  Medication Sig Dispense Refill  . alendronate (FOSAMAX) 70 MG tablet once a week.     . Biotin 1000 MCG CHEW Chew 1 tablet by mouth in the morning and at bedtime. 30 tablet   . Black Pepper-Turmeric 3-500 MG CAPS Take 500 mg by mouth 2 (two) times daily.    . Calcium Carbonate-Vit D-Min (CALCIUM 600+D PLUS MINERALS) 600-400 MG-UNIT CHEW Chew 1 each by mouth daily.    . cholecalciferol (VITAMIN D) 1000 units tablet Take 2,000 Units by mouth daily.    Marland Kitchen letrozole (FEMARA) 2.5 MG tablet Take 1 tablet (2.5 mg total) by mouth daily. 90 tablet 3  . Multiple Vitamin (MULTIVITAMIN) tablet Take 1 tablet by mouth daily.    Marland Kitchen olmesartan-hydrochlorothiazide (BENICAR HCT) 40-25 MG tablet     . simvastatin (ZOCOR) 40 MG tablet Take 40 mg by mouth daily.   2   No current facility-administered medications for this visit.    PHYSICAL EXAMINATION: ECOG PERFORMANCE STATUS: 1 - Symptomatic but completely ambulatory  There were no vitals filed for this visit. There were no vitals filed for this visit.  BREAST: No palpable masses or nodules in either right or left breasts. No palpable axillary supraclavicular or infraclavicular adenopathy no breast tenderness or nipple discharge. (  exam performed in the presence of a chaperone)  LABORATORY DATA:  I have reviewed the data as listed CMP Latest Ref Rng & Units 05/01/2018 02/04/2018  Glucose 70 - 99 mg/dL 118(H) 103  BUN 8 - 23 mg/dL 20 19  Creatinine 0.44 - 1.00 mg/dL 0.66 0.77  Sodium 135 - 145 mmol/L 140 141  Potassium 3.5 - 5.1 mmol/L 3.7  3.8  Chloride 98 - 111 mmol/L 104 106  CO2 22 - 32 mmol/L 27 28  Calcium 8.9 - 10.3 mg/dL 10.4(H) 10.7(H)  Total Protein 6.4 - 8.3 g/dL - 7.0  Total Bilirubin 0.2 - 1.2 mg/dL - 0.5  Alkaline Phos 40 - 150 U/L - 101  AST 5 - 34 U/L - 14  ALT 0 - 55 U/L - 16    Lab Results  Component Value Date   WBC 8.2 02/04/2018   HGB 13.2 02/04/2018   HCT 40.4 02/04/2018   MCV 86.8 02/04/2018   PLT 252 02/04/2018   NEUTROABS 5.7 02/04/2018    ASSESSMENT & PLAN:  Malignant neoplasm of upper-inner quadrant of right breast in female, estrogen receptor positive ([place]) 02/20/2018:Right lumpectomy: IDC with DCIS, 0.6 cm, grade 1, 0/2 lymph nodes negative, T1 b N0 stage Ia, ER 100%, PR 95%, HER-2 negative ratio 1.24 Genetic testing negative  Treatment plan: 1. Adjuvant radiation:Based on PRIME-2 study results, decided not to do radiation. 2.Adjuvant antiestrogen therapywith letrozole 2.5 mg daily x5 to 7 yearsstarted 02/27/2018.  Letrozole toxicities:  Joint and muscle stiffness that are bearable    Breast cancer surveillance: 1.  Breast exam 11/30/2020: Benign 2.  Mammogram 03/23/2020: Benign breast density category A  Return to clinic in1 year for follow-up    No orders of the defined types were placed in this encounter.  The patient has a good understanding of the overall plan. she agrees with it. she will call with any problems that may develop before the next visit here.  Total time spent: 20 mins including face to face time and time spent for planning, charting and coordination of care  Rulon Eisenmenger, MD, MPH 11/30/2020  I, Cloyde Reams Dorshimer, am acting as scribe for Dr. Nicholas Lose.  I have reviewed the above documentation for accuracy and completeness, and I agree with the above.

## 2020-11-30 ENCOUNTER — Inpatient Hospital Stay: Payer: Medicare Other | Attending: Hematology and Oncology | Admitting: Hematology and Oncology

## 2020-11-30 ENCOUNTER — Other Ambulatory Visit: Payer: Self-pay

## 2020-11-30 DIAGNOSIS — Z79811 Long term (current) use of aromatase inhibitors: Secondary | ICD-10-CM | POA: Diagnosis not present

## 2020-11-30 DIAGNOSIS — Z1501 Genetic susceptibility to malignant neoplasm of breast: Secondary | ICD-10-CM | POA: Diagnosis not present

## 2020-11-30 DIAGNOSIS — Z885 Allergy status to narcotic agent status: Secondary | ICD-10-CM | POA: Diagnosis not present

## 2020-11-30 DIAGNOSIS — Z880 Allergy status to penicillin: Secondary | ICD-10-CM | POA: Insufficient documentation

## 2020-11-30 DIAGNOSIS — Z170 Estrogen receptor positive status [ER+]: Secondary | ICD-10-CM | POA: Diagnosis not present

## 2020-11-30 DIAGNOSIS — Z888 Allergy status to other drugs, medicaments and biological substances status: Secondary | ICD-10-CM | POA: Diagnosis not present

## 2020-11-30 DIAGNOSIS — C50211 Malignant neoplasm of upper-inner quadrant of right female breast: Secondary | ICD-10-CM | POA: Insufficient documentation

## 2020-11-30 DIAGNOSIS — Z79899 Other long term (current) drug therapy: Secondary | ICD-10-CM | POA: Insufficient documentation

## 2020-11-30 MED ORDER — ATORVASTATIN CALCIUM 20 MG PO TABS: 20.0000 mg | ORAL_TABLET | Freq: Every day | ORAL | Status: AC

## 2020-11-30 MED ORDER — LETROZOLE 2.5 MG PO TABS: 2.5000 mg | ORAL_TABLET | Freq: Every day | ORAL | 3 refills | Status: DC

## 2020-11-30 MED ORDER — AMLODIPINE BESYLATE 2.5 MG PO TABS: 2.5000 mg | ORAL_TABLET | Freq: Every day | ORAL | Status: AC

## 2020-11-30 NOTE — Assessment & Plan Note (Signed)
02/20/2018:Right lumpectomy: IDC with DCIS, 0.6 cm, grade 1, 0/2 lymph nodes negative, T1 b N0 stage Ia, ER 100%, PR 95%, HER-2 negative ratio 1.24 Genetic testing negative  Treatment plan: 1. Adjuvant radiation:Based on PRIME-2 study results, decided not to do radiation. 2.Adjuvant antiestrogen therapywith letrozole 2.5 mg daily x5 to 7 yearsstarted 02/27/2018.  Letrozole toxicities: 1.Joint and muscle stiffness that are bearable 2.Hair thinning 3.Splitting of nails   Breast cancer surveillance: 1.  Breast exam 11/30/2020: Benign 2.  Mammogram 03/23/2020: Benign breast density category A  Return to clinic in1 year for follow-up

## 2020-12-05 DIAGNOSIS — D2272 Melanocytic nevi of left lower limb, including hip: Secondary | ICD-10-CM | POA: Diagnosis not present

## 2020-12-05 DIAGNOSIS — Z85828 Personal history of other malignant neoplasm of skin: Secondary | ICD-10-CM | POA: Diagnosis not present

## 2020-12-05 DIAGNOSIS — L821 Other seborrheic keratosis: Secondary | ICD-10-CM | POA: Diagnosis not present

## 2020-12-05 DIAGNOSIS — D1801 Hemangioma of skin and subcutaneous tissue: Secondary | ICD-10-CM | POA: Diagnosis not present

## 2020-12-05 DIAGNOSIS — L570 Actinic keratosis: Secondary | ICD-10-CM | POA: Diagnosis not present

## 2020-12-05 DIAGNOSIS — D225 Melanocytic nevi of trunk: Secondary | ICD-10-CM | POA: Diagnosis not present

## 2021-01-09 DIAGNOSIS — Z23 Encounter for immunization: Secondary | ICD-10-CM | POA: Diagnosis not present

## 2021-02-01 DIAGNOSIS — E785 Hyperlipidemia, unspecified: Secondary | ICD-10-CM | POA: Diagnosis not present

## 2021-02-01 DIAGNOSIS — Z79899 Other long term (current) drug therapy: Secondary | ICD-10-CM | POA: Diagnosis not present

## 2021-03-05 DIAGNOSIS — Z20822 Contact with and (suspected) exposure to covid-19: Secondary | ICD-10-CM | POA: Diagnosis not present

## 2021-03-29 DIAGNOSIS — I10 Essential (primary) hypertension: Secondary | ICD-10-CM | POA: Diagnosis not present

## 2021-03-29 DIAGNOSIS — C50311 Malignant neoplasm of lower-inner quadrant of right female breast: Secondary | ICD-10-CM | POA: Diagnosis not present

## 2021-03-29 DIAGNOSIS — E785 Hyperlipidemia, unspecified: Secondary | ICD-10-CM | POA: Diagnosis not present

## 2021-04-04 DIAGNOSIS — Z853 Personal history of malignant neoplasm of breast: Secondary | ICD-10-CM | POA: Diagnosis not present

## 2021-04-23 DIAGNOSIS — Z20822 Contact with and (suspected) exposure to covid-19: Secondary | ICD-10-CM | POA: Diagnosis not present

## 2021-05-18 DIAGNOSIS — L237 Allergic contact dermatitis due to plants, except food: Secondary | ICD-10-CM | POA: Diagnosis not present

## 2021-05-18 DIAGNOSIS — M8588 Other specified disorders of bone density and structure, other site: Secondary | ICD-10-CM | POA: Diagnosis not present

## 2021-05-18 DIAGNOSIS — G43909 Migraine, unspecified, not intractable, without status migrainosus: Secondary | ICD-10-CM | POA: Diagnosis not present

## 2021-05-18 DIAGNOSIS — I10 Essential (primary) hypertension: Secondary | ICD-10-CM | POA: Diagnosis not present

## 2021-05-18 DIAGNOSIS — R7303 Prediabetes: Secondary | ICD-10-CM | POA: Diagnosis not present

## 2021-05-18 DIAGNOSIS — H9202 Otalgia, left ear: Secondary | ICD-10-CM | POA: Diagnosis not present

## 2021-05-18 DIAGNOSIS — E785 Hyperlipidemia, unspecified: Secondary | ICD-10-CM | POA: Diagnosis not present

## 2021-05-18 DIAGNOSIS — E559 Vitamin D deficiency, unspecified: Secondary | ICD-10-CM | POA: Diagnosis not present

## 2021-05-18 DIAGNOSIS — N952 Postmenopausal atrophic vaginitis: Secondary | ICD-10-CM | POA: Diagnosis not present

## 2021-05-18 DIAGNOSIS — Z0000 Encounter for general adult medical examination without abnormal findings: Secondary | ICD-10-CM | POA: Diagnosis not present

## 2021-05-30 DIAGNOSIS — M85852 Other specified disorders of bone density and structure, left thigh: Secondary | ICD-10-CM | POA: Diagnosis not present

## 2021-05-30 DIAGNOSIS — Z780 Asymptomatic menopausal state: Secondary | ICD-10-CM | POA: Diagnosis not present

## 2021-05-30 DIAGNOSIS — M85851 Other specified disorders of bone density and structure, right thigh: Secondary | ICD-10-CM | POA: Diagnosis not present

## 2021-06-06 DIAGNOSIS — Z170 Estrogen receptor positive status [ER+]: Secondary | ICD-10-CM | POA: Diagnosis not present

## 2021-06-06 DIAGNOSIS — C50211 Malignant neoplasm of upper-inner quadrant of right female breast: Secondary | ICD-10-CM | POA: Diagnosis not present

## 2021-06-18 DIAGNOSIS — H9202 Otalgia, left ear: Secondary | ICD-10-CM | POA: Diagnosis not present

## 2021-06-28 DIAGNOSIS — H903 Sensorineural hearing loss, bilateral: Secondary | ICD-10-CM | POA: Diagnosis not present

## 2021-07-18 DIAGNOSIS — Z23 Encounter for immunization: Secondary | ICD-10-CM | POA: Diagnosis not present

## 2021-08-05 DIAGNOSIS — Z23 Encounter for immunization: Secondary | ICD-10-CM | POA: Diagnosis not present

## 2021-09-27 DIAGNOSIS — Z85828 Personal history of other malignant neoplasm of skin: Secondary | ICD-10-CM | POA: Diagnosis not present

## 2021-09-27 DIAGNOSIS — L821 Other seborrheic keratosis: Secondary | ICD-10-CM | POA: Diagnosis not present

## 2021-09-27 DIAGNOSIS — L570 Actinic keratosis: Secondary | ICD-10-CM | POA: Diagnosis not present

## 2021-09-27 DIAGNOSIS — D1801 Hemangioma of skin and subcutaneous tissue: Secondary | ICD-10-CM | POA: Diagnosis not present

## 2021-09-27 DIAGNOSIS — R208 Other disturbances of skin sensation: Secondary | ICD-10-CM | POA: Diagnosis not present

## 2021-11-15 DIAGNOSIS — Z20822 Contact with and (suspected) exposure to covid-19: Secondary | ICD-10-CM | POA: Diagnosis not present

## 2021-11-16 ENCOUNTER — Telehealth: Payer: Self-pay

## 2021-11-16 NOTE — Telephone Encounter (Signed)
Called and spoke with pt, informed per MD had to reschedule 3/3 appointment to 3/9.  Pt verbalized understanding and thanks

## 2021-11-20 DIAGNOSIS — E559 Vitamin D deficiency, unspecified: Secondary | ICD-10-CM | POA: Diagnosis not present

## 2021-11-20 DIAGNOSIS — E785 Hyperlipidemia, unspecified: Secondary | ICD-10-CM | POA: Diagnosis not present

## 2021-11-20 DIAGNOSIS — R7303 Prediabetes: Secondary | ICD-10-CM | POA: Diagnosis not present

## 2021-11-20 DIAGNOSIS — R29898 Other symptoms and signs involving the musculoskeletal system: Secondary | ICD-10-CM | POA: Diagnosis not present

## 2021-11-20 DIAGNOSIS — C50311 Malignant neoplasm of lower-inner quadrant of right female breast: Secondary | ICD-10-CM | POA: Diagnosis not present

## 2021-11-20 DIAGNOSIS — I10 Essential (primary) hypertension: Secondary | ICD-10-CM | POA: Diagnosis not present

## 2021-11-27 DIAGNOSIS — H25813 Combined forms of age-related cataract, bilateral: Secondary | ICD-10-CM | POA: Diagnosis not present

## 2021-11-30 ENCOUNTER — Ambulatory Visit: Payer: Medicare Other | Admitting: Hematology and Oncology

## 2021-12-05 NOTE — Progress Notes (Signed)
? ?Patient Care Team: ?Harlan Stains, MD as PCP - General (Family Medicine) ?Excell Seltzer, MD (Inactive) as Consulting Physician (General Surgery) ?Nicholas Lose, MD as Consulting Physician (Hematology and Oncology) ?Gery Pray, MD as Consulting Physician (Radiation Oncology) ? ?DIAGNOSIS:  ?  ICD-10-CM   ?1. Malignant neoplasm of upper-inner quadrant of right breast in female, estrogen receptor positive ([place])  C50.211   ? Z17.0   ?  ? ? ?SUMMARY OF ONCOLOGIC HISTORY: ?Oncology History  ?Malignant neoplasm of upper-inner quadrant of right breast in female, estrogen receptor positive ([place])  ?01/30/2018 Initial Diagnosis  ? Screening detected right breast mass 0.5 cm at 1 o'clock position 9 cm from nipple, axilla negative biopsy: Invasive ductal carcinoma and a papillary lesion grade 1-2, ER PR positive HER-2 negative Ki-67 20%, T1 a N0 stage I a clinical stage AJCC 8 ?  ?02/13/2018 Genetic Testing  ? The Common Hereditary Cancers Panel + Myelodysplastic Syndrome/Leukemia Panel was ordered (54 genes) The following genes were evaluated for sequence changes and exonic deletions/duplications: APC, ATM, AXIN2, BARD1, BLM, BMPR1A, BRCA1, BRCA2, BRIP1, CDH1, CDK4, CDKN2A (p14ARF), CDKN2A (p16INK4a), CEBPA, CHEK2, CTNNA1, DICER1, EPCAM*, GATA2, GREM1*, HRAS, KIT, MEN1, MLH1, MSH2, MSH3, MSH6, MUTYH, NBN, NF1, PALB2, PDGFRA, PMS2, POLD1, POLE, PTEN, RAD50, RAD51C, RAD51D, RUNX1, SDHB, SDHC, SDHD, SMAD4, SMARCA4, STK11, TERC, TERT, TP53, TSC1, TSC2, VHL ?The following genes were evaluated for sequence changes only:HOXB13*, NTHL1*, SDHA ? ?Results: Negative, no pathogenic variants identified.  The date of this test report is 02/13/2018.  ?  ?02/20/2018 Surgery  ? Right lumpectomy: IDC with DCIS, 0.6 cm, grade 1, 0/2 lymph nodes negative, T1 b N0 stage Ia, ER 100%, PR 95%, HER-2 negative ratio 1.24 ?  ?03/29/2018 -  Anti-estrogen oral therapy  ? Letrozole 2.5 mg daily ?  ? ? ?CHIEF COMPLIANT: Follow-up of right breast  cancer on letrozole therapy ? ?INTERVAL HISTORY: Margaret Hunter is a 76 y.o. with above-mentioned history of breast cancer treated with lumpectomy and who is currently on antiestrogen therapy with letrozole. Mammogram on 04/04/2021 showed no evidence of malignancy bilaterally. She presents to the clinic today for follow-up.  She is complaining of joint stiffness and achiness especially after she sits down for a while.  Denies any pain lumps or nodules in the breast. ? ?ALLERGIES:  is allergic to tramadol and penicillin g. ? ?MEDICATIONS:  ?Current Outpatient Medications  ?Medication Sig Dispense Refill  ? alendronate (FOSAMAX) 70 MG tablet once a week.     ? amLODipine (NORVASC) 2.5 MG tablet Take 1 tablet (2.5 mg total) by mouth daily.    ? atorvastatin (LIPITOR) 20 MG tablet Take 1 tablet (20 mg total) by mouth daily.    ? Biotin 1000 MCG CHEW Chew 1 tablet by mouth in the morning and at bedtime. 30 tablet   ? Black Pepper-Turmeric 3-500 MG CAPS Take 500 mg by mouth 2 (two) times daily.    ? cholecalciferol (VITAMIN D) 1000 units tablet Take 2,000 Units by mouth daily.    ? letrozole (FEMARA) 2.5 MG tablet Take 1 tablet (2.5 mg total) by mouth daily. 90 tablet 3  ? Multiple Vitamin (MULTIVITAMIN) tablet Take 1 tablet by mouth daily.    ? olmesartan-hydrochlorothiazide (BENICAR HCT) 40-25 MG tablet     ? ?No current facility-administered medications for this visit.  ? ? ?PHYSICAL EXAMINATION: ?ECOG PERFORMANCE STATUS: 1 - Symptomatic but completely ambulatory ? ?Vitals:  ? 12/06/21 1023  ?BP: (!) 151/60  ?Pulse: 68  ?Resp: 18  ?Temp:  97.6 ?F (36.4 ?C)  ?SpO2: 99%  ? ?Filed Weights  ? 12/06/21 1023  ?Weight: 190 lb 6.4 oz (86.4 kg)  ? ? ?BREAST: No palpable masses or nodules in either right or left breasts. No palpable axillary supraclavicular or infraclavicular adenopathy no breast tenderness or nipple discharge. (exam performed in the presence of a chaperone) ? ?LABORATORY DATA:  ?I have reviewed the data as  listed ?CMP Latest Ref Rng & Units 05/01/2018 02/04/2018  ?Glucose 70 - 99 mg/dL 118(H) 103  ?BUN 8 - 23 mg/dL 20 19  ?Creatinine 0.44 - 1.00 mg/dL 0.66 0.77  ?Sodium 135 - 145 mmol/L 140 141  ?Potassium 3.5 - 5.1 mmol/L 3.7 3.8  ?Chloride 98 - 111 mmol/L 104 106  ?CO2 22 - 32 mmol/L 27 28  ?Calcium 8.9 - 10.3 mg/dL 10.4(H) 10.7(H)  ?Total Protein 6.4 - 8.3 g/dL - 7.0  ?Total Bilirubin 0.2 - 1.2 mg/dL - 0.5  ?Alkaline Phos 40 - 150 U/L - 101  ?AST 5 - 34 U/L - 14  ?ALT 0 - 55 U/L - 16  ? ? ?Lab Results  ?Component Value Date  ? WBC 8.2 02/04/2018  ? HGB 13.2 02/04/2018  ? HCT 40.4 02/04/2018  ? MCV 86.8 02/04/2018  ? PLT 252 02/04/2018  ? NEUTROABS 5.7 02/04/2018  ? ? ?ASSESSMENT & PLAN:  ?Malignant neoplasm of upper-inner quadrant of right breast in female, estrogen receptor positive ([place]) ?02/20/2018:Right lumpectomy: IDC with DCIS, 0.6 cm, grade 1, 0/2 lymph nodes negative, T1 b N0 stage Ia, ER 100%, PR 95%, HER-2 negative ratio 1.24 ?Genetic testing negative ?   ?Treatment plan: ?1. Adjuvant radiation: Based on PRIME-2 study results, decided not to do radiation. ?2. Adjuvant antiestrogen therapy  with letrozole 2.5 mg daily x5 to 7 years started 02/27/2018.  ?  ?Letrozole toxicities: To be completed June 2024 ? Joint and muscle stiffness that are bearable ?   ?Breast cancer surveillance: ?1.  Breast exam 12/06/2021: Benign ?2.  Mammogram  04/04/2021: Benign breast density category A ?Bone density 05/30/2021: T score -1.8: Osteopenia: Patient takes Fosamax with calcium and vitamin D ?  ?Return to clinic in 1 year for follow-up ? ? ? ?No orders of the defined types were placed in this encounter. ? ?The patient has a good understanding of the overall plan. she agrees with it. she will call with any problems that may develop before the next visit here. ? ?Total time spent: 20 mins including face to face time and time spent for planning, charting and coordination of care ? ?Rulon Eisenmenger, MD, MPH ?12/06/2021 ? ?I, Thana Ates,  am acting as scribe for Dr. Nicholas Lose. ? ?I have reviewed the above documentation for accuracy and completeness, and I agree with the above. ? ? ? ? ? ? ?

## 2021-12-06 ENCOUNTER — Inpatient Hospital Stay: Payer: Medicare Other | Attending: Hematology and Oncology | Admitting: Hematology and Oncology

## 2021-12-06 ENCOUNTER — Other Ambulatory Visit: Payer: Self-pay

## 2021-12-06 DIAGNOSIS — Z170 Estrogen receptor positive status [ER+]: Secondary | ICD-10-CM | POA: Diagnosis not present

## 2021-12-06 DIAGNOSIS — M2560 Stiffness of unspecified joint, not elsewhere classified: Secondary | ICD-10-CM | POA: Diagnosis not present

## 2021-12-06 DIAGNOSIS — Z79811 Long term (current) use of aromatase inhibitors: Secondary | ICD-10-CM | POA: Diagnosis not present

## 2021-12-06 DIAGNOSIS — Z79899 Other long term (current) drug therapy: Secondary | ICD-10-CM | POA: Diagnosis not present

## 2021-12-06 DIAGNOSIS — Z880 Allergy status to penicillin: Secondary | ICD-10-CM | POA: Insufficient documentation

## 2021-12-06 DIAGNOSIS — C50211 Malignant neoplasm of upper-inner quadrant of right female breast: Secondary | ICD-10-CM | POA: Insufficient documentation

## 2021-12-06 DIAGNOSIS — Z885 Allergy status to narcotic agent status: Secondary | ICD-10-CM | POA: Diagnosis not present

## 2021-12-06 DIAGNOSIS — Z888 Allergy status to other drugs, medicaments and biological substances status: Secondary | ICD-10-CM | POA: Insufficient documentation

## 2021-12-06 MED ORDER — LETROZOLE 2.5 MG PO TABS: 2.5000 mg | ORAL_TABLET | Freq: Every day | ORAL | 4 refills | Status: DC

## 2021-12-06 NOTE — Assessment & Plan Note (Signed)
02/20/2018:Right lumpectomy: IDC with DCIS, 0.6 cm, grade 1, 0/2 lymph nodes negative, T1 b N0 stage Ia, ER 100%, PR 95%, HER-2 negative ratio 1.24 ?Genetic testing negative ??? ?Treatment plan: ?1. Adjuvant radiation:?Based on?PRIME-2?study results, decided not to do radiation. ?2.?Adjuvant antiestrogen therapy??with letrozole 2.5 mg daily x5 to 7 years?started 02/27/2018.? ?? ?Letrozole toxicities: ? Joint and muscle stiffness that are bearable ? ? ?Breast cancer surveillance: ?1.??Breast exam 12/06/2021: Benign ?2.??Mammogram  04/04/2021: Benign breast density category A ?Bone density 05/30/2021: T score -1.8: Osteopenia ?? ?Return to clinic in?1 year for follow-up ?

## 2021-12-18 DIAGNOSIS — L814 Other melanin hyperpigmentation: Secondary | ICD-10-CM | POA: Diagnosis not present

## 2021-12-18 DIAGNOSIS — L821 Other seborrheic keratosis: Secondary | ICD-10-CM | POA: Diagnosis not present

## 2021-12-18 DIAGNOSIS — L438 Other lichen planus: Secondary | ICD-10-CM | POA: Diagnosis not present

## 2021-12-18 DIAGNOSIS — D225 Melanocytic nevi of trunk: Secondary | ICD-10-CM | POA: Diagnosis not present

## 2021-12-18 DIAGNOSIS — L304 Erythema intertrigo: Secondary | ICD-10-CM | POA: Diagnosis not present

## 2021-12-18 DIAGNOSIS — D1801 Hemangioma of skin and subcutaneous tissue: Secondary | ICD-10-CM | POA: Diagnosis not present

## 2021-12-18 DIAGNOSIS — L570 Actinic keratosis: Secondary | ICD-10-CM | POA: Diagnosis not present

## 2021-12-18 DIAGNOSIS — Z85828 Personal history of other malignant neoplasm of skin: Secondary | ICD-10-CM | POA: Diagnosis not present

## 2021-12-18 DIAGNOSIS — D2272 Melanocytic nevi of left lower limb, including hip: Secondary | ICD-10-CM | POA: Diagnosis not present

## 2021-12-18 DIAGNOSIS — L300 Nummular dermatitis: Secondary | ICD-10-CM | POA: Diagnosis not present

## 2022-01-15 DIAGNOSIS — Z20822 Contact with and (suspected) exposure to covid-19: Secondary | ICD-10-CM | POA: Diagnosis not present

## 2022-04-09 DIAGNOSIS — Z1231 Encounter for screening mammogram for malignant neoplasm of breast: Secondary | ICD-10-CM | POA: Diagnosis not present

## 2022-04-19 ENCOUNTER — Ambulatory Visit (INDEPENDENT_AMBULATORY_CARE_PROVIDER_SITE_OTHER): Payer: Medicare Other | Admitting: Family Medicine

## 2022-04-19 ENCOUNTER — Ambulatory Visit
Admission: RE | Admit: 2022-04-19 | Discharge: 2022-04-19 | Disposition: A | Discharge status: Home / Self Care | Payer: Medicare Other | Source: Ambulatory Visit | Attending: Family Medicine | Admitting: Family Medicine

## 2022-04-19 ENCOUNTER — Encounter: Payer: Self-pay | Admitting: Family Medicine

## 2022-04-19 VITALS — BP 126/84 | Ht 62.0 in | Wt 188.0 lb

## 2022-04-19 DIAGNOSIS — M79644 Pain in right finger(s): Secondary | ICD-10-CM | POA: Diagnosis not present

## 2022-04-19 NOTE — Progress Notes (Signed)
  Margaret Hunter - 76 y.o. female MRN [id number]  Date of birth: [birth year]    SUBJECTIVE:      Chief Complaint:/ HPI:  Pain in right ring finger.  Injury occurred several days ago.  She was holding her dog's leash when the dog lunged forward and she felt some pain in her right ring finger.  Since then she has had continuous pain and swelling.  She cannot fully straighten the ring finger out at the distal tip.  Currently pain is improved but still present.  Pertinent past medical history, past social history: History of breast cancer: Her son is a sports medicine physician in [place].  Vickki Muff.    OBJECTIVE: BP 126/84 (BP Location: Left Arm, Patient Position: Sitting, Cuff Size: Normal)   Ht '5\' 2"'$  (1.575 m)   Wt 188 lb (85.3 kg)   BMI 34.39 kg/m   Physical Exam:  Vital signs are reviewed. GENERAL: Well-developed female no acute distress HANDS: Bilaterally she has osteoarthritic changes at multiple DIP and a few at the PIP joints of both hands.  The right ring finger is mildly erythematous at the end and has extensor lag.  She cannot fully expand the finger.  She is tender to palpation over the dorsum.  Capillary refill and sensation are intact.  ASSESSMENT & PLAN: Mallet finger, subacute: Placed her in an extension splint today.  Due to her significant osteoarthrosis, the stack splint was not comfortable so we used an extension splint.  We will get an x-ray.  This will rule out fracture, bony mallet finger.  See her back in 1 to 2 weeks.  Addendum: X-ray reveals opacity at the PIP fourth ring finger.  Not formally read yet.  I called her and discussed.  I will bring her back in no charge Monday for a formal ultrasound of the ring finger.  If she has tendon rupture, then we will refer her to orthopedic hand surgery.  She is in agreement with this plan. See problem based charting & AVS for pt instructions. No problem-specific Assessment & Plan notes found for this encounter.

## 2022-04-22 ENCOUNTER — Ambulatory Visit (INDEPENDENT_AMBULATORY_CARE_PROVIDER_SITE_OTHER): Payer: Medicare Other | Admitting: Family Medicine

## 2022-04-22 ENCOUNTER — Ambulatory Visit: Payer: Self-pay

## 2022-04-22 VITALS — BP 124/80 | Ht 62.0 in | Wt 188.0 lb

## 2022-04-22 DIAGNOSIS — M79644 Pain in right finger(s): Secondary | ICD-10-CM

## 2022-04-23 NOTE — Progress Notes (Signed)
[place] for mallet finger to ensure tendon architecture

## 2022-05-17 ENCOUNTER — Ambulatory Visit: Payer: Medicare Other | Admitting: Family Medicine

## 2022-05-24 ENCOUNTER — Ambulatory Visit: Payer: Self-pay

## 2022-05-24 ENCOUNTER — Ambulatory Visit (INDEPENDENT_AMBULATORY_CARE_PROVIDER_SITE_OTHER): Payer: Medicare Other | Admitting: Family Medicine

## 2022-05-24 VITALS — BP 144/53 | Ht 62.0 in | Wt 188.0 lb

## 2022-05-24 DIAGNOSIS — M20011 Mallet finger of right finger(s): Secondary | ICD-10-CM | POA: Diagnosis not present

## 2022-05-24 DIAGNOSIS — M1810 Unilateral primary osteoarthritis of first carpometacarpal joint, unspecified hand: Secondary | ICD-10-CM | POA: Diagnosis not present

## 2022-05-24 DIAGNOSIS — M79644 Pain in right finger(s): Secondary | ICD-10-CM

## 2022-05-24 MED ORDER — METHYLPREDNISOLONE ACETATE 40 MG/ML IJ SUSP
40.0000 mg | Freq: Once | INTRAMUSCULAR | Status: AC
Start: 2022-05-24 — End: 2022-05-24
  Administered 2022-05-24: 40 mg via INTRA_ARTICULAR

## 2022-05-24 NOTE — Patient Instructions (Signed)
For the mallet finger, lets continue with the brace as is and see you back in 2 to 3 weeks.  You are at 5 weeks today so that we will be  a total of 7 or 8 weeks.  Likely at that point we can take you out of the day splint and just put you in night splint only for 2 more weeks and then you will be free.  We injected your right thumb today.  You will have some numbness for a few hours.  You may have a small increase in pain tomorrow as in 10% and then it should get better over the next 1 to 2 weeks.  By the time I see you back you should be seeing maximal benefit from the shot.    You should not have any more pain than 10% increase, it should not swell up big and red, you should not have red streaks or fever.  If any of those things occur, you need to be seen immediately at the emergency room.  Please call if you have questions or issues in the meantime.  It was great seeing you!

## 2022-05-25 ENCOUNTER — Encounter: Payer: Self-pay | Admitting: Family Medicine

## 2022-05-25 DIAGNOSIS — M20011 Mallet finger of right finger(s): Secondary | ICD-10-CM | POA: Insufficient documentation

## 2022-05-25 DIAGNOSIS — M1810 Unilateral primary osteoarthritis of first carpometacarpal joint, unspecified hand: Secondary | ICD-10-CM | POA: Insufficient documentation

## 2022-05-25 NOTE — Assessment & Plan Note (Signed)
She has been very diligent with the splint.  We will continue for another 3 weeks and see her back.  Likely then we can fully evaluate strength of the tendon and hopefully can transition her to additional 2 to 3 weeks of night splint.

## 2022-05-25 NOTE — Progress Notes (Signed)
  Margaret Hunter - 76 y.o. female MRN [id number]  Date of birth: April 10, 1946    SUBJECTIVE:      Chief Complaint:/ HPI: 1.  Follow-up mallet injury ofRight ring finger.  She has continued to be in the extension splint full-time.  Pain is improved.  She is currently 5 weeks out from initial injury. 2.  Bilateral thumb pain.  Significant osteoarthritis.  Has previously had corticosteroid injections that seem to help.  She would like to consider them again today on both sides.  Particularly with the problems she has now on the right hand with the fourth finger in a splint, she is having more problems opening jars etc. and noticing more thumb pain.     OBJECTIVE: BP (!) 144/53   Ht '5\' 2"'$  (1.575 m)   Wt 188 lb (85.3 kg)   BMI 34.39 kg/m   Physical Exam:  Vital signs are reviewed. GENERAL: Well-developed female no acute distress HANDS: Bilaterally she has significant deformity of the MCP thumb joints.  She has relatively full range of motion but poor apposition secondary to deformity.  She has tenderness palpation bilateral MCP thumb.  Distally she has intact sensation. Mallet finger: Right ring finger without swelling.  Cautious evaluation with the finger lifted 2 to 3 mm above splint shows that she is able to maintain full extension.  ULTRASOUND-guided injection right CMP: Patient given informed consent, signed copy in the chart.  Area prepped and draped in usual sterile fashion.  Hockey-stick portion of the ultrasound was used to define the CMP joint space which was very narrow and surrounded by osteophytes.  21-gauge 1-1/2 inch needle was used under sterile conditions to inject combination of one-to-one methylprednisolone 40 mg per mill and 1% lidocaine without epinephrine, total volume 1 cc into the MCP joint.  Patient tolerated procedure well.  ASSESSMENT & PLAN:  See problem based charting & AVS for pt instructions. Mallet finger of right hand She has been very diligent with the splint.  We  will continue for another 3 weeks and see her back.  Likely then we can fully evaluate strength of the tendon and hopefully can transition her to additional 2 to 3 weeks of night splint.  Osteoarthritis of thumb Significant DJD bilateral thumbs with deformity.  Under ultrasound guidance today we did injection of the centimeter CMP joint.  We will see her back in 3 weeks to follow-up her mallet finger and then will consider at that time whether or not she wants to pursue injection of the left thumb.

## 2022-05-25 NOTE — Assessment & Plan Note (Signed)
Significant DJD bilateral thumbs with deformity.  Under ultrasound guidance today we did injection of the centimeter CMP joint.  We will see her back in 3 weeks to follow-up her mallet finger and then will consider at that time whether or not she wants to pursue injection of the left thumb.

## 2022-05-30 DIAGNOSIS — M791 Myalgia, unspecified site: Secondary | ICD-10-CM | POA: Diagnosis not present

## 2022-05-30 DIAGNOSIS — Z0000 Encounter for general adult medical examination without abnormal findings: Secondary | ICD-10-CM | POA: Diagnosis not present

## 2022-05-30 DIAGNOSIS — N952 Postmenopausal atrophic vaginitis: Secondary | ICD-10-CM | POA: Diagnosis not present

## 2022-05-30 DIAGNOSIS — E559 Vitamin D deficiency, unspecified: Secondary | ICD-10-CM | POA: Diagnosis not present

## 2022-05-30 DIAGNOSIS — G43909 Migraine, unspecified, not intractable, without status migrainosus: Secondary | ICD-10-CM | POA: Diagnosis not present

## 2022-05-30 DIAGNOSIS — R7303 Prediabetes: Secondary | ICD-10-CM | POA: Diagnosis not present

## 2022-05-30 DIAGNOSIS — C50311 Malignant neoplasm of lower-inner quadrant of right female breast: Secondary | ICD-10-CM | POA: Diagnosis not present

## 2022-05-30 DIAGNOSIS — M8588 Other specified disorders of bone density and structure, other site: Secondary | ICD-10-CM | POA: Diagnosis not present

## 2022-05-30 DIAGNOSIS — I10 Essential (primary) hypertension: Secondary | ICD-10-CM | POA: Diagnosis not present

## 2022-05-30 DIAGNOSIS — L237 Allergic contact dermatitis due to plants, except food: Secondary | ICD-10-CM | POA: Diagnosis not present

## 2022-05-30 DIAGNOSIS — E785 Hyperlipidemia, unspecified: Secondary | ICD-10-CM | POA: Diagnosis not present

## 2022-06-14 ENCOUNTER — Ambulatory Visit (INDEPENDENT_AMBULATORY_CARE_PROVIDER_SITE_OTHER): Payer: Medicare Other | Admitting: Family Medicine

## 2022-06-14 VITALS — BP 136/70 | Ht 62.0 in

## 2022-06-14 DIAGNOSIS — M20011 Mallet finger of right finger(s): Secondary | ICD-10-CM

## 2022-06-16 NOTE — Progress Notes (Signed)
  Margaret Hunter - 76 y.o. female MRN [id number]  Date of birth: [birth year]    SUBJECTIVE:      Chief Complaint:/ HPI:  #1.  Follow-up right mallet finger: Right ring finger.  She has now been in the extension splint continuously for 8 weeks. 2.  Follow-up corticosteroid injection into the [facility] of the right thumb at last visit.  This did not help a whole lot.  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    OBJECTIVE: BP 136/70   Ht '5\' 2"'$  (1.575 m)   BMI 34.39 kg/m   Physical Exam:  Vital signs are reviewed. GENERAL: Well-developed female no acute distress HAND: Bilateral CMC joints with significant deformity.  She still has some range of motion but not a lot of strength of bilateral thumbs. Right ring finger: Splint was removed and she had no significant droop.  She was able to fully extend.  There is some deformity of the DIP joint related to osteoarthritis at that joint. Quick ultrasound was taken which shows intact tendon with previously observed osteophyte still in place.  The previously noted avulsed tendon fragment is no longer seen.  Copy placed in media tab of the chart.  No charge for the ultrasound.  ASSESSMENT & PLAN:  See problem based charting & AVS for pt instructions. Mallet finger of right hand Right ring finger: Now completed 8 weeks in extension splint.  We will have her splint for 3 weeks at night only unless she has pain or notices significant fatigue of that joint in which case she will splint at some during the day.  She is a little complicated because she has significant OA at the DIP joint.  She has done really well maintaining it in splint.  She will follow-up as needed

## 2022-06-16 NOTE — Assessment & Plan Note (Signed)
Right ring finger: Now completed 8 weeks in extension splint.  We will have her splint for 3 weeks at night only unless she has pain or notices significant fatigue of that joint in which case she will splint at some during the day.  She is a little complicated because she has significant OA at the DIP joint.  She has done really well maintaining it in splint.  She will follow-up as needed

## 2022-06-30 DIAGNOSIS — Z23 Encounter for immunization: Secondary | ICD-10-CM | POA: Diagnosis not present

## 2022-07-14 DIAGNOSIS — Z23 Encounter for immunization: Secondary | ICD-10-CM | POA: Diagnosis not present

## 2022-07-30 DIAGNOSIS — C50211 Malignant neoplasm of upper-inner quadrant of right female breast: Secondary | ICD-10-CM | POA: Diagnosis not present

## 2022-07-30 DIAGNOSIS — Z170 Estrogen receptor positive status [ER+]: Secondary | ICD-10-CM | POA: Diagnosis not present

## 2022-08-29 ENCOUNTER — Ambulatory Visit: Payer: Medicare Other | Admitting: Sports Medicine

## 2022-08-29 DIAGNOSIS — R051 Acute cough: Secondary | ICD-10-CM | POA: Diagnosis not present

## 2022-08-29 DIAGNOSIS — R509 Fever, unspecified: Secondary | ICD-10-CM | POA: Diagnosis not present

## 2022-08-29 DIAGNOSIS — B349 Viral infection, unspecified: Secondary | ICD-10-CM | POA: Diagnosis not present

## 2022-09-26 ENCOUNTER — Other Ambulatory Visit: Payer: Self-pay | Admitting: *Deleted

## 2022-09-26 ENCOUNTER — Ambulatory Visit
Admission: RE | Admit: 2022-09-26 | Discharge: 2022-09-26 | Disposition: A | Discharge status: Home / Self Care | Payer: Medicare Other | Source: Ambulatory Visit | Attending: Sports Medicine | Admitting: Sports Medicine

## 2022-09-26 DIAGNOSIS — S6991XA Unspecified injury of right wrist, hand and finger(s), initial encounter: Secondary | ICD-10-CM | POA: Diagnosis not present

## 2022-09-26 DIAGNOSIS — M20011 Mallet finger of right finger(s): Secondary | ICD-10-CM

## 2022-10-03 ENCOUNTER — Ambulatory Visit (INDEPENDENT_AMBULATORY_CARE_PROVIDER_SITE_OTHER): Payer: Medicare Other | Admitting: Sports Medicine

## 2022-10-03 VITALS — BP 114/78 | Ht 62.5 in | Wt 184.0 lb

## 2022-10-03 DIAGNOSIS — M20011 Mallet finger of right finger(s): Secondary | ICD-10-CM | POA: Diagnosis not present

## 2022-10-03 NOTE — Progress Notes (Signed)
Margaret Hunter - 77 y.o. female MRN [id number]  Date of birth: 27-Feb-1946    CHIEF COMPLAINT:   Finger stiffness    SUBJECTIVE:   HPI:  Pleasant 77 year old female comes clinic to follow-up bony mallet finger of right fourth finger.  She fractured this finger back in July 2023.  She wore a splint in extension for 6 weeks.  She eventually transition out of that.  She noticed that her fourth finger is still in a little bit of flexion at the DIP joint.  She consulted with her son who is a sports medicine physician who advised her to put the splint back on come back to have it reevaluated with an x-ray.  She obtained an x-ray on 09/26/22 that showed healing of the old fracture.  She now has some mild degenerative changes seen at the DIP joint secondary to that.  Today she says the finger feels good.  Is not painful.  It is a little bit stiff.  She is able to use it with most of her daily activities.  She has tried manually straining and strengthening on her own at home.  She basically wants to know today if she needs to continue wearing the splint and if her finger is always going to be just a little bit bent.   ROS:     See HPI  PERTINENT  PMH / PSH FH / / SH:  Past Medical, Surgical, Social, and Family History Reviewed & Updated in the EMR.  Pertinent findings include:  None  OBJECTIVE: BP 114/78   Ht 5' 2.5" (1.588 m)   Wt 184 lb (83.5 kg)   BMI 33.12 kg/m   Physical Exam:  Vital signs are reviewed.  GEN: Alert and oriented, NAD Pulm: Breathing unlabored PSY: normal mood, congruent affect  MSK: Right ring finger - DIP joint is in few degrees of flexion.  She is nontender to palpation at the DIP joint, IP joint, or MCP joint.  She has full range of motion in flexion extension at the PIP joint.  She is unable to get full flexion or full extension at the DIP joint.  She has 5/5 strength with resisted flexion and extension at the DIP joint.  She is neurovascularly intact  distally  ASSESSMENT & PLAN:  1.  Right fourth mallet finger, subsequent encounter  -I reviewed the x-rays with the patient today.  We discussed that the extensor tendon is healed at this point.  There is no need for to continue to wear a splint to keep the finger in extension.  We also discussed that she will not regain full extension at that DIP joint but she can continue to work on regaining a few degrees of extension by doing some home occupational therapy exercises.  She was offered formal occupational therapy but declined.  She is satisfied with her functionality at present.  She can follow-up as needed.  All questions were answered and she agrees to the plan.  Dortha Kern, MD PGY-4, Sports Medicine Fellow [place]  Patient seen and evaluated with the sports medicine fellow.  I agree with the above plan of care.  Margaret Hunter understands that she will have a chronic extension lag of this finger but the x-ray shows the avulsion to be well-healed.  I think her main issue is some stiffness in this finger from immobilization so she will work diligently to regain flexion.  I also explained that we could refer her to occupational therapy  but she declined for now.  She will follow-up as needed.  This note was dictated using Dragon naturally speaking software and may contain errors in syntax, spelling, or content which have not been identified prior to signing this note.

## 2022-12-03 ENCOUNTER — Ambulatory Visit
Admission: RE | Admit: 2022-12-03 | Discharge: 2022-12-03 | Disposition: A | Discharge status: Home / Self Care | Payer: Medicare Other | Source: Ambulatory Visit | Attending: Family Medicine | Admitting: Family Medicine

## 2022-12-03 ENCOUNTER — Other Ambulatory Visit: Payer: Self-pay | Admitting: Family Medicine

## 2022-12-03 DIAGNOSIS — E785 Hyperlipidemia, unspecified: Secondary | ICD-10-CM | POA: Diagnosis not present

## 2022-12-03 DIAGNOSIS — R052 Subacute cough: Secondary | ICD-10-CM | POA: Diagnosis not present

## 2022-12-03 DIAGNOSIS — E6609 Other obesity due to excess calories: Secondary | ICD-10-CM | POA: Diagnosis not present

## 2022-12-03 DIAGNOSIS — J984 Other disorders of lung: Secondary | ICD-10-CM | POA: Diagnosis not present

## 2022-12-03 DIAGNOSIS — R918 Other nonspecific abnormal finding of lung field: Secondary | ICD-10-CM | POA: Diagnosis not present

## 2022-12-03 DIAGNOSIS — R7303 Prediabetes: Secondary | ICD-10-CM | POA: Diagnosis not present

## 2022-12-03 DIAGNOSIS — C50311 Malignant neoplasm of lower-inner quadrant of right female breast: Secondary | ICD-10-CM | POA: Diagnosis not present

## 2022-12-03 DIAGNOSIS — I10 Essential (primary) hypertension: Secondary | ICD-10-CM | POA: Diagnosis not present

## 2022-12-06 NOTE — Progress Notes (Signed)
Patient Care Team: Harlan Stains, MD as PCP - General (Family Medicine) Excell Seltzer, MD (Inactive) as Consulting Physician (General Surgery) Nicholas Lose, MD as Consulting Physician (Hematology and Oncology) Gery Pray, MD as Consulting Physician (Radiation Oncology)  DIAGNOSIS: No diagnosis found.  SUMMARY OF ONCOLOGIC HISTORY: Oncology History  Malignant neoplasm of upper-inner quadrant of right breast in female, estrogen receptor positive ([place])  01/30/2018 Initial Diagnosis   Screening detected right breast mass 0.5 cm at 1 o'clock position 9 cm from nipple, axilla negative biopsy: Invasive ductal carcinoma and a papillary lesion grade 1-2, ER PR positive HER-2 negative Ki-67 20%, T1 a N0 stage I a clinical stage AJCC 8   02/13/2018 Genetic Testing   The Common Hereditary Cancers Panel + Myelodysplastic Syndrome/Leukemia Panel was ordered (54 genes) The following genes were evaluated for sequence changes and exonic deletions/duplications: APC, ATM, AXIN2, BARD1, BLM, BMPR1A, BRCA1, BRCA2, BRIP1, CDH1, CDK4, CDKN2A (p14ARF), CDKN2A (p16INK4a), CEBPA, CHEK2, CTNNA1, DICER1, EPCAM*, GATA2, GREM1*, HRAS, KIT, MEN1, MLH1, MSH2, MSH3, MSH6, MUTYH, NBN, NF1, PALB2, PDGFRA, PMS2, POLD1, POLE, PTEN, RAD50, RAD51C, RAD51D, RUNX1, SDHB, SDHC, SDHD, SMAD4, SMARCA4, STK11, TERC, TERT, TP53, TSC1, TSC2, VHL The following genes were evaluated for sequence changes only:HOXB13*, NTHL1*, SDHA  Results: Negative, no pathogenic variants identified.  The date of this test report is 02/13/2018.    02/20/2018 Surgery   Right lumpectomy: IDC with DCIS, 0.6 cm, grade 1, 0/2 lymph nodes negative, T1 b N0 stage Ia, ER 100%, PR 95%, HER-2 negative ratio 1.24   03/29/2018 -  Anti-estrogen oral therapy   Letrozole 2.5 mg daily     CHIEF COMPLIANT:   INTERVAL HISTORY: Margaret Hunter is a   ALLERGIES:  is allergic to tramadol and penicillin g.  MEDICATIONS:  Current Outpatient Medications   Medication Sig Dispense Refill   alendronate (FOSAMAX) 70 MG tablet once a week.      amLODipine (NORVASC) 2.5 MG tablet Take 1 tablet (2.5 mg total) by mouth daily.     atorvastatin (LIPITOR) 20 MG tablet Take 1 tablet (20 mg total) by mouth daily.     Biotin 1000 MCG CHEW Chew 1 tablet by mouth in the morning and at bedtime. 30 tablet    Black Pepper-Turmeric 3-500 MG CAPS Take 500 mg by mouth 2 (two) times daily.     cholecalciferol (VITAMIN D) 1000 units tablet Take 2,000 Units by mouth daily.     letrozole (FEMARA) 2.5 MG tablet Take 1 tablet (2.5 mg total) by mouth daily. 90 tablet 4   Multiple Vitamin (MULTIVITAMIN) tablet Take 1 tablet by mouth daily.     olmesartan-hydrochlorothiazide (BENICAR HCT) 40-25 MG tablet      No current facility-administered medications for this visit.    PHYSICAL EXAMINATION: ECOG PERFORMANCE STATUS: {CHL ONC ECOG PS:[id number]}  There were no vitals filed for this visit. There were no vitals filed for this visit.  BREAST:*** No palpable masses or nodules in either right or left breasts. No palpable axillary supraclavicular or infraclavicular adenopathy no breast tenderness or nipple discharge. (exam performed in the presence of a chaperone)  LABORATORY DATA:  I have reviewed the data as listed    Latest Ref Rng & Units 05/01/2018    2:00 PM 02/04/2018    8:29 AM  CMP  Glucose 70 - 99 mg/dL 118  103   BUN 8 - 23 mg/dL 20  19   Creatinine 0.44 - 1.00 mg/dL 0.66  0.77   Sodium 135 - 145  mmol/L 140  141   Potassium 3.5 - 5.1 mmol/L 3.7  3.8   Chloride 98 - 111 mmol/L 104  106   CO2 22 - 32 mmol/L 27  28   Calcium 8.9 - 10.3 mg/dL 10.4  10.7   Total Protein 6.4 - 8.3 g/dL  7.0   Total Bilirubin 0.2 - 1.2 mg/dL  0.5   Alkaline Phos 40 - 150 U/L  101   AST 5 - 34 U/L  14   ALT 0 - 55 U/L  16     Lab Results  Component Value Date   WBC 8.2 02/04/2018   HGB 13.2 02/04/2018   HCT 40.4 02/04/2018   MCV 86.8 02/04/2018   PLT 252 02/04/2018    NEUTROABS 5.7 02/04/2018    ASSESSMENT & PLAN:  No problem-specific Assessment & Plan notes found for this encounter.    No orders of the defined types were placed in this encounter.  The patient has a good understanding of the overall plan. she agrees with it. she will call with any problems that may develop before the next visit here. Total time spent: 30 mins including face to face time and time spent for planning, charting and co-ordination of care   Suzzette Righter, [place] 12/06/22    I Gardiner Coins am acting as a Education administrator for [employer]  ***

## 2022-12-10 ENCOUNTER — Inpatient Hospital Stay: Payer: Medicare Other | Attending: Hematology and Oncology | Admitting: Hematology and Oncology

## 2022-12-10 ENCOUNTER — Other Ambulatory Visit: Payer: Self-pay

## 2022-12-10 VITALS — BP 157/64 | HR 69 | Temp 97.6°F | Resp 18 | Ht 62.5 in | Wt 189.5 lb

## 2022-12-10 DIAGNOSIS — Z880 Allergy status to penicillin: Secondary | ICD-10-CM | POA: Diagnosis not present

## 2022-12-10 DIAGNOSIS — Z170 Estrogen receptor positive status [ER+]: Secondary | ICD-10-CM | POA: Diagnosis not present

## 2022-12-10 DIAGNOSIS — R5383 Other fatigue: Secondary | ICD-10-CM | POA: Diagnosis not present

## 2022-12-10 DIAGNOSIS — R059 Cough, unspecified: Secondary | ICD-10-CM | POA: Diagnosis not present

## 2022-12-10 DIAGNOSIS — Z79899 Other long term (current) drug therapy: Secondary | ICD-10-CM | POA: Insufficient documentation

## 2022-12-10 DIAGNOSIS — C50211 Malignant neoplasm of upper-inner quadrant of right female breast: Secondary | ICD-10-CM | POA: Diagnosis not present

## 2022-12-10 DIAGNOSIS — Z79811 Long term (current) use of aromatase inhibitors: Secondary | ICD-10-CM | POA: Insufficient documentation

## 2022-12-10 DIAGNOSIS — R509 Fever, unspecified: Secondary | ICD-10-CM | POA: Diagnosis not present

## 2022-12-10 NOTE — Assessment & Plan Note (Addendum)
02/20/2018:Right lumpectomy: IDC with DCIS, 0.6 cm, grade 1, 0/2 lymph nodes negative, T1 b N0 stage Ia, ER 100%, PR 95%, HER-2 negative ratio 1.24 Genetic testing negative    Treatment plan: 1. Adjuvant radiation: Based on PRIME-2 study results, decided not to do radiation. 2. Adjuvant antiestrogen therapy  with letrozole 2.5 mg daily x5 to 7 years started 02/27/2018.    Letrozole toxicities: To be completed June 2024  Joint and muscle stiffness that are bearable    Breast cancer surveillance: 1.  Breast exam 12/10/2022: Benign 2.  Mammogram 04/09/2022 at [facility]: Benign breast density category A Bone density 05/30/2021: T score -1.8: Osteopenia: Patient takes Fosamax with calcium and vitamin D   Return to clinic in 1 year for follow-up with long-term survivorship clinic

## 2022-12-25 ENCOUNTER — Other Ambulatory Visit: Payer: Self-pay | Admitting: Hematology and Oncology

## 2023-01-08 ENCOUNTER — Ambulatory Visit
Admission: RE | Admit: 2023-01-08 | Discharge: 2023-01-08 | Disposition: A | Discharge status: Home / Self Care | Payer: Medicare Other | Source: Ambulatory Visit | Attending: Family Medicine | Admitting: Family Medicine

## 2023-01-08 ENCOUNTER — Other Ambulatory Visit: Payer: Self-pay | Admitting: Family Medicine

## 2023-01-08 DIAGNOSIS — R9389 Abnormal findings on diagnostic imaging of other specified body structures: Secondary | ICD-10-CM

## 2023-01-08 DIAGNOSIS — R053 Chronic cough: Secondary | ICD-10-CM | POA: Diagnosis not present

## 2023-01-11 ENCOUNTER — Other Ambulatory Visit: Payer: Self-pay | Admitting: Family Medicine

## 2023-01-11 DIAGNOSIS — R9389 Abnormal findings on diagnostic imaging of other specified body structures: Secondary | ICD-10-CM

## 2023-01-20 ENCOUNTER — Ambulatory Visit
Admission: RE | Admit: 2023-01-20 | Discharge: 2023-01-20 | Disposition: A | Discharge status: Home / Self Care | Payer: Medicare Other | Source: Ambulatory Visit | Attending: Family Medicine | Admitting: Family Medicine

## 2023-01-20 DIAGNOSIS — R9389 Abnormal findings on diagnostic imaging of other specified body structures: Secondary | ICD-10-CM

## 2023-01-20 DIAGNOSIS — M47812 Spondylosis without myelopathy or radiculopathy, cervical region: Secondary | ICD-10-CM | POA: Diagnosis not present

## 2023-01-20 DIAGNOSIS — I700 Atherosclerosis of aorta: Secondary | ICD-10-CM | POA: Diagnosis not present

## 2023-01-20 DIAGNOSIS — I2510 Atherosclerotic heart disease of native coronary artery without angina pectoris: Secondary | ICD-10-CM | POA: Diagnosis not present

## 2023-01-20 DIAGNOSIS — J984 Other disorders of lung: Secondary | ICD-10-CM | POA: Diagnosis not present

## 2023-01-20 MED ORDER — IOPAMIDOL (ISOVUE-300) INJECTION 61%
75.0000 mL | Freq: Once | INTRAVENOUS | Status: AC | PRN
  Administered 2023-01-20: 75 mL via INTRAVENOUS

## 2023-02-11 DIAGNOSIS — L438 Other lichen planus: Secondary | ICD-10-CM | POA: Diagnosis not present

## 2023-02-11 DIAGNOSIS — L218 Other seborrheic dermatitis: Secondary | ICD-10-CM | POA: Diagnosis not present

## 2023-02-11 DIAGNOSIS — L304 Erythema intertrigo: Secondary | ICD-10-CM | POA: Diagnosis not present

## 2023-02-11 DIAGNOSIS — Z85828 Personal history of other malignant neoplasm of skin: Secondary | ICD-10-CM | POA: Diagnosis not present

## 2023-02-11 DIAGNOSIS — L821 Other seborrheic keratosis: Secondary | ICD-10-CM | POA: Diagnosis not present

## 2023-02-11 DIAGNOSIS — L814 Other melanin hyperpigmentation: Secondary | ICD-10-CM | POA: Diagnosis not present

## 2023-02-11 DIAGNOSIS — D1801 Hemangioma of skin and subcutaneous tissue: Secondary | ICD-10-CM | POA: Diagnosis not present

## 2023-04-11 DIAGNOSIS — Z1231 Encounter for screening mammogram for malignant neoplasm of breast: Secondary | ICD-10-CM | POA: Diagnosis not present

## 2023-04-15 ENCOUNTER — Encounter: Payer: Self-pay | Admitting: Hematology and Oncology

## 2023-05-27 DIAGNOSIS — H524 Presbyopia: Secondary | ICD-10-CM | POA: Diagnosis not present

## 2023-05-27 DIAGNOSIS — H2513 Age-related nuclear cataract, bilateral: Secondary | ICD-10-CM | POA: Diagnosis not present

## 2023-06-03 DIAGNOSIS — M25562 Pain in left knee: Secondary | ICD-10-CM | POA: Diagnosis not present

## 2023-06-04 DIAGNOSIS — Z853 Personal history of malignant neoplasm of breast: Secondary | ICD-10-CM | POA: Diagnosis not present

## 2023-06-04 DIAGNOSIS — M8588 Other specified disorders of bone density and structure, other site: Secondary | ICD-10-CM | POA: Diagnosis not present

## 2023-06-10 DIAGNOSIS — M7122 Synovial cyst of popliteal space [Baker], left knee: Secondary | ICD-10-CM | POA: Diagnosis not present

## 2023-06-10 DIAGNOSIS — S83232A Complex tear of medial meniscus, current injury, left knee, initial encounter: Secondary | ICD-10-CM | POA: Diagnosis not present

## 2023-06-10 DIAGNOSIS — S83272A Complex tear of lateral meniscus, current injury, left knee, initial encounter: Secondary | ICD-10-CM | POA: Diagnosis not present

## 2023-06-10 DIAGNOSIS — M948X6 Other specified disorders of cartilage, lower leg: Secondary | ICD-10-CM | POA: Diagnosis not present

## 2023-06-10 DIAGNOSIS — M25562 Pain in left knee: Secondary | ICD-10-CM | POA: Diagnosis not present

## 2023-06-12 DIAGNOSIS — J3089 Other allergic rhinitis: Secondary | ICD-10-CM | POA: Diagnosis not present

## 2023-06-12 DIAGNOSIS — R052 Subacute cough: Secondary | ICD-10-CM | POA: Diagnosis not present

## 2023-06-17 DIAGNOSIS — G43909 Migraine, unspecified, not intractable, without status migrainosus: Secondary | ICD-10-CM | POA: Diagnosis not present

## 2023-06-17 DIAGNOSIS — I10 Essential (primary) hypertension: Secondary | ICD-10-CM | POA: Diagnosis not present

## 2023-06-17 DIAGNOSIS — Z0000 Encounter for general adult medical examination without abnormal findings: Secondary | ICD-10-CM | POA: Diagnosis not present

## 2023-06-17 DIAGNOSIS — N952 Postmenopausal atrophic vaginitis: Secondary | ICD-10-CM | POA: Diagnosis not present

## 2023-06-17 DIAGNOSIS — E6609 Other obesity due to excess calories: Secondary | ICD-10-CM | POA: Diagnosis not present

## 2023-06-17 DIAGNOSIS — E559 Vitamin D deficiency, unspecified: Secondary | ICD-10-CM | POA: Diagnosis not present

## 2023-06-17 DIAGNOSIS — R21 Rash and other nonspecific skin eruption: Secondary | ICD-10-CM | POA: Diagnosis not present

## 2023-06-17 DIAGNOSIS — E785 Hyperlipidemia, unspecified: Secondary | ICD-10-CM | POA: Diagnosis not present

## 2023-06-17 DIAGNOSIS — M25562 Pain in left knee: Secondary | ICD-10-CM | POA: Diagnosis not present

## 2023-06-17 DIAGNOSIS — Z853 Personal history of malignant neoplasm of breast: Secondary | ICD-10-CM | POA: Diagnosis not present

## 2023-06-17 DIAGNOSIS — M8588 Other specified disorders of bone density and structure, other site: Secondary | ICD-10-CM | POA: Diagnosis not present

## 2023-06-17 DIAGNOSIS — R7303 Prediabetes: Secondary | ICD-10-CM | POA: Diagnosis not present

## 2023-06-27 NOTE — Progress Notes (Unsigned)
Margaret Hunter, female    DOB: [birth year]   MRN: [id number]   Brief patient profile:  87  yowf never smoker never problem until 70s coughs tended to  linger p uri but did not require rx referred to pulmonary clinic 06/30/2023 by Dr Cliffton Asters  for bad flu-like iillness  Nov 2023 with cough ever exac with another ? viral URI Jan 2024 and even worse since   Rx march 2024 with albuterol/ nasal spray ? Maybe some better p prednisone and singulair but persisted. Omeprazole some better Allergist/whelan > dust/ mold rx trelegy / nasal spray and antihistamine some better [name] added dpi> Worse   Note every intervention ranked as some better x dpi which made it worse      History of Present Illness  06/30/2023  Pulmonary/ 1st office eval/Renee Erb  Chief Complaint  Patient presents with   Consult    Cough ( 9 months and dry ) pt has been on prednisone and antibiotics. pt had a ct which showed nodules  but was not on the lungs. Has tried reflex meds, has tried albuterol inhaler. Pt seen a allergist   Dyspnea:  Not limited by breathing from desired activities  / limited by L knee but but prior to this no problem Cough: severe coughing fits mostly dry with sensation of globus / using lots of halls seems to helps but only while has one in mouth  Worse with voice use  Sleep: flat bed bid pillow on L side maybe once a week  SABA use: none   No obvious other day to day or daytime pattern or assoc excess/ purulent sputum or mucus plugs or hemoptysis or cp or chest tightness, subjective wheeze or overt sinus or hb symptoms.    Also denies any obvious fluctuation of symptoms with weather or environmental changes or other aggravating or alleviating factors except as outlined above   No unusual exposure hx or h/o childhood pna/ asthma or knowledge of premature birth.  Current Allergies, Complete Past Medical History, Past Surgical History, Family History, and Social History were reviewed in [employer] record.  ROS  The following are not active complaints unless bolded Hoarseness, sore throat= globus sensation , dysphagia, dental problems, itching, sneezing,  nasal congestion or discharge of excess mucus or purulent secretions, ear ache,   fever, chills, sweats, unintended wt loss or wt gain, classically pleuritic or exertional cp,  orthopnea pnd or arm/hand swelling  or leg swelling, presyncope, palpitations, abdominal pain, anorexia, nausea, vomiting, diarrhea  or change in bowel habits or change in bladder habits, change in stools or change in urine, dysuria, hematuria,  rash, arthralgias, visual complaints, headache, numbness, weakness or ataxia or problems with walking or coordination,  change in mood or  memory.             Outpatient Medications Prior to Visit  Medication Sig Dispense Refill   alendronate (FOSAMAX) 70 MG tablet once a week.      amLODipine (NORVASC) 2.5 MG tablet Take 1 tablet (2.5 mg total) by mouth daily.     atorvastatin (LIPITOR) 20 MG tablet Take 1 tablet (20 mg total) by mouth daily.     Azelastine HCl 137 MCG/SPRAY SOLN Place 137 mcg into the nose 2 (two) times daily.     Biotin 1000 MCG CHEW Chew 1 tablet by mouth in the morning and at bedtime. 30 tablet    Black Pepper-Turmeric 3-500 MG CAPS Take 500 mg by mouth 2 (  two) times daily.     cholecalciferol (VITAMIN D) 1000 units tablet Take 2,000 Units by mouth daily.     Multiple Vitamin (MULTIVITAMIN) tablet Take 1 tablet by mouth daily.     olmesartan-hydrochlorothiazide (BENICAR HCT) 40-25 MG tablet      TRELEGY ELLIPTA 100-62.5-25 MCG/ACT AEPB Inhale 1 puff into the lungs daily.     letrozole (FEMARA) 2.5 MG tablet TAKE 1 TABLET(2.5 MG) BY MOUTH DAILY 90 tablet 4   No facility-administered medications prior to visit.    Past Medical History:  Diagnosis Date   Cancer (HCC) 01/2018   right breast cancer   Cholelithiasis    Family history of ovarian cancer    Hypercholesterolemia     Hypertension       Objective:     BP (!) 147/78   Pulse 75   Ht 5\' 2"  (1.575 m)   Wt 191 lb 3.2 oz (86.7 kg)   SpO2 95%   BMI 34.97 kg/m   SpO2: 95 % RA  Very pleasant mod obese (by BMI) amb wf nad    HEENT : Oropharynx  no cobblestoning or pnds      Nasal turbinates nl    NECK :  without  apparent JVD/ palpable Nodes/TM    LUNGS: no acc muscle use,  Nl contour chest which is clear to A and P bilaterally without cough on insp or exp maneuvers   CV:  RRR  no s3 or murmur or increase in P2, and no edema   ABD:  soft and nontender with nl inspiratory excursion in the supine position. No bruits or organomegaly appreciated   MS:  Nl gait/ ext warm without deformities Or obvious joint restrictions  calf tenderness, cyanosis or clubbing    SKIN: warm and dry without lesions    NEURO:  alert, approp, nl sensorium with  no motor or cerebellar deficits apparent.      I personally reviewed images and agree with radiology impression as follows:   Chest CT 01/20/23   w contrast 1. No pulmonary nodules. 2. Bilateral dense costovertebral spurs at the T9 level, corresponding to the nodular densities seen on the recent chest radiographs. 3. Diffuse hepatic steatosis. 4.  Calcific coronary artery and aortic atherosclerosis.      Assessment   Upper airway cough syndrome Onset since Nov 2023  - cyclical cough rx 06/30/2023 >>>   Upper airway cough syndrome (previously labeled PNDS),  is so named because it's frequently impossible to sort out how much is  CR/sinusitis with freq throat clearing (which can be related to primary GERD)   vs  causing  secondary (" extra esophageal")  GERD from wide swings in gastric pressure that occur with throat clearing, often  promoting self use of mint and menthol lozenges that reduce the lower esophageal sphincter tone and exacerbate the problem further in a cyclical fashion.   These are the same pts (now being labeled as having "irritable larynx  syndrome" by some cough centers) who not infrequently have a history of having failed to tolerate ace inhibitors,  dry powder inhalers or biphosphonates (was tried on dpi and worse/ still on fosamax)  or report having atypical/extraesophageal reflux symptoms(like globus, which she has)  that don't respond to standard doses of PPI  and are easily confused as having aecopd or asthma flares by even experienced allergists/ pulmonologists (myself included).   Of the three most common causes of  Sub-acute / recurrent or chronic cough, only one (GERD)  can actually contribute to/ trigger  the other two (asthma and post nasal drip syndrome)  and perpetuate the cylce of cough.  While not intuitively obvious, many patients with chronic low grade reflux do not cough until there is a primary insult that disturbs the protective epithelial barrier and exposes sensitive nerve endings.   This is typically viral but can due to PNDS and  either may apply here.   The point is that once this occurs, it is difficult to eliminate the cycle  using anything but a maximally effective acid suppression regimen at least in the short run, accompanied by an appropriate diet to address non acid GERD and control / eliminate the cough itself for at least 3 days with tessalon pearls and >>>  added 6 day taper off  Prednisone starting at 40 mg per day in case of component of Th-2 driven upper or lower airways inflammation (if cough responds short term only to relapse before return while will on full rx for uacs (as above), then  that would point to allergic rhinitis/ asthma or eos bronchitis as alternative dx)   F/u in 4-6 weeks with all meds in hand using a trust but verify approach to confirm accurate Medication  Reconciliation The principal here is that until we are certain that the  patients are doing what we've asked, it makes no sense to ask them to do more.          Each maintenance medication was reviewed in detail including  emphasizing most importantly the difference between maintenance and prns and under what circumstances the prns are to be triggered using an action plan format where appropriate.  Total time for H and P, chart review, counseling, reviewing  device(s) and generating customized AVS unique to this office visit / same day charting   > for refractory respiratory  symptoms of uncertain etiology in pt new to me.           Sandrea Hughs, MD 06/30/2023

## 2023-06-30 ENCOUNTER — Encounter: Payer: Self-pay | Admitting: Internal Medicine

## 2023-06-30 ENCOUNTER — Ambulatory Visit (INDEPENDENT_AMBULATORY_CARE_PROVIDER_SITE_OTHER): Payer: Medicare Other | Admitting: Internal Medicine

## 2023-06-30 VITALS — BP 147/78 | HR 75 | Ht 62.0 in | Wt 191.2 lb

## 2023-06-30 DIAGNOSIS — R058 Other specified cough: Secondary | ICD-10-CM | POA: Insufficient documentation

## 2023-06-30 MED ORDER — BENZONATATE 200 MG PO CAPS: 200.0000 mg | ORAL_CAPSULE | Freq: Three times a day (TID) | ORAL | 1 refills | Status: DC | PRN

## 2023-06-30 MED ORDER — PREDNISONE 10 MG PO TABS
ORAL_TABLET | ORAL | 0 refills | Status: DC
Start: 2023-06-30 — End: 2023-07-29

## 2023-06-30 MED ORDER — PANTOPRAZOLE SODIUM 40 MG PO TBEC
40.0000 mg | DELAYED_RELEASE_TABLET | Freq: Every day | ORAL | 2 refills | Status: DC
Start: 2023-06-30 — End: 2023-09-27

## 2023-06-30 MED ORDER — FAMOTIDINE 20 MG PO TABS
ORAL_TABLET | ORAL | 11 refills | Status: DC
Start: 2023-06-30 — End: 2024-02-02

## 2023-06-30 NOTE — Patient Instructions (Addendum)
Upper airway cough syndrome = [name]   Stop fosamax , trelegy, nasal spray, levocertizine   Pantoprazole (protonix) 40 mg   Take  30-60 min before first meal of the day and Pepcid (famotidine)  20 mg after supper until return to office - this is the best way to tell whether stomach acid is contributing to your problem.  GERD (REFLUX)  is an extremely common cause of respiratory symptoms just like yours , many times with no obvious heartburn at all.    It can be treated with medication, but also with lifestyle changes including elevation of the head of your bed (ideally with 6 -8inch blocks under the headboard of your bed),  Smoking cessation, avoidance of late meals, excessive alcohol, and avoid fatty foods, chocolate, peppermint, colas, red wine, and acidic juices such as orange juice.  NO MINT OR MENTHOL PRODUCTS SO NO COUGH DROPS  USE SUGARLESS CANDY INSTEAD (Jolley ranchers or Stover's or Life Savers) or even ice chips will also do - the key is to swallow to prevent all throat clearing. NO OIL BASED VITAMINS - use powdered substitutes.  Avoid fish oil when coughing.     Prednisone 10 mg take  4 each am x 2 days,   2 each am x 2 days,  1 each am x 2 days and stop    For cough or urge to cough  > add tessalon pearl  200 every 4-6 hours as needed (add to the candy)   Please schedule a follow up office visit in 4-6  weeks, sooner if needed  with all medications /inhalers/ solutions in hand so we can verify exactly what you are taking. This includes all medications from all doctors and over the counters

## 2023-07-01 NOTE — Assessment & Plan Note (Signed)
Onset since Nov 2023  - cyclical cough rx 06/30/2023 >>>   Upper airway cough syndrome (previously labeled PNDS),  is so named because it's frequently impossible to sort out how much is  CR/sinusitis with freq throat clearing (which can be related to primary GERD)   vs  causing  secondary (" extra esophageal")  GERD from wide swings in gastric pressure that occur with throat clearing, often  promoting self use of mint and menthol lozenges that reduce the lower esophageal sphincter tone and exacerbate the problem further in a cyclical fashion.   These are the same pts (now being labeled as having "irritable larynx syndrome" by some cough centers) who not infrequently have a history of having failed to tolerate ace inhibitors,  dry powder inhalers or biphosphonates (was tried on dpi and worse/ still on fosamax)  or report having atypical/extraesophageal reflux symptoms(like globus, which she has)  that don't respond to standard doses of PPI  and are easily confused as having aecopd or asthma flares by even experienced allergists/ pulmonologists (myself included).   Of the three most common causes of  Sub-acute / recurrent or chronic cough, only one (GERD)  can actually contribute to/ trigger  the other two (asthma and post nasal drip syndrome)  and perpetuate the cylce of cough.  While not intuitively obvious, many patients with chronic low grade reflux do not cough until there is a primary insult that disturbs the protective epithelial barrier and exposes sensitive nerve endings.   This is typically viral but can due to PNDS and  either may apply here.   The point is that once this occurs, it is difficult to eliminate the cycle  using anything but a maximally effective acid suppression regimen at least in the short run, accompanied by an appropriate diet to address non acid GERD and control / eliminate the cough itself for at least 3 days with tessalon pearls and >>>  added 6 day taper off  Prednisone  starting at 40 mg per day in case of component of Th-2 driven upper or lower airways inflammation (if cough responds short term only to relapse before return while will on full rx for uacs (as above), then  that would point to allergic rhinitis/ asthma or eos bronchitis as alternative dx)   F/u in 4-6 weeks with all meds in hand using a trust but verify approach to confirm accurate Medication  Reconciliation The principal here is that until we are certain that the  patients are doing what we've asked, it makes no sense to ask them to do more.          Each maintenance medication was reviewed in detail including emphasizing most importantly the difference between maintenance and prns and under what circumstances the prns are to be triggered using an action plan format where appropriate.  Total time for H and P, chart review, counseling, reviewing  device(s) and generating customized AVS unique to this office visit / same day charting   > for refractory respiratory  symptoms of uncertain etiology in pt new to me.

## 2023-07-13 DIAGNOSIS — Z23 Encounter for immunization: Secondary | ICD-10-CM | POA: Diagnosis not present

## 2023-07-28 DIAGNOSIS — M1712 Unilateral primary osteoarthritis, left knee: Secondary | ICD-10-CM | POA: Diagnosis not present

## 2023-07-29 ENCOUNTER — Encounter: Payer: Self-pay | Admitting: Internal Medicine

## 2023-07-29 ENCOUNTER — Ambulatory Visit (INDEPENDENT_AMBULATORY_CARE_PROVIDER_SITE_OTHER): Payer: Medicare Other | Admitting: Internal Medicine

## 2023-07-29 DIAGNOSIS — R058 Other specified cough: Secondary | ICD-10-CM

## 2023-07-29 DIAGNOSIS — M810 Age-related osteoporosis without current pathological fracture: Secondary | ICD-10-CM | POA: Diagnosis not present

## 2023-07-29 MED ORDER — BUDESONIDE-FORMOTEROL FUMARATE 80-4.5 MCG/ACT IN AERO: INHALATION_SPRAY | RESPIRATORY_TRACT | 12 refills | Status: DC

## 2023-07-29 MED ORDER — PREDNISONE 10 MG PO TABS
ORAL_TABLET | ORAL | 0 refills | Status: DC
Start: 2023-07-29 — End: 2023-09-02

## 2023-07-29 MED ORDER — BENZONATATE 200 MG PO CAPS: 200.0000 mg | ORAL_CAPSULE | Freq: Three times a day (TID) | ORAL | 1 refills | Status: DC | PRN

## 2023-07-29 NOTE — Progress Notes (Signed)
Margaret Hunter, female    DOB: 26-Jul-1946   MRN: [id number]   Brief patient profile:  69  yowf never smoker never problem until 70s coughs tended to  linger p uri but did not require rx referred to pulmonary clinic 06/30/2023 by Dr Cliffton Asters  for bad flu-like iillness  Nov 2023 with cough ever exac with another ? viral URI Jan 2024 and even worse since   Rx march 2024 with albuterol/ nasal spray ? Maybe some better p prednisone and singulair but persisted. Omeprazole some better Allergist/whelan > dust/ mold rx trelegy / nasal spray and antihistamine some better [name] added dpi> Worse   Note every intervention ranked as some better x dpi which made it worse      History of Present Illness  06/30/2023  Pulmonary/ 1st office eval/Margaret Hunter  Chief Complaint  Patient presents with   Consult    Cough ( 9 months and dry ) pt has been on prednisone and antibiotics. pt had a ct which showed nodules  but was not on the lungs. Has tried reflex meds, has tried albuterol inhaler. Pt seen a allergist   Dyspnea:  Not limited by breathing from desired activities  / limited by L knee but but prior to this no problem Cough: severe coughing fits mostly dry with sensation of globus / using lots of halls seems to helps but only while has one in mouth  Worse with voice use  Sleep: flat bed bid pillow on L side maybe once a week  SABA use: none  Rec Upper airway cough syndrome = Margaret Hunter  Stop fosamax , trelegy, nasal spray, levocertizine  Pantoprazole (protonix) 40 mg   Take  30-60 min before first meal of the day and Pepcid (famotidine)  20 mg after supper until return to office GERD diet reviewed, bed blocks rec   Prednisone 10 mg take  4 each am x 2 days,   2 each am x 2 days,  1 each am x 2 days and stop  For cough or urge to cough  > add tessalon pearl  200 every 4-6 hours as needed (add to the candy)  Please schedule a follow up office visit in 4-6  weeks, sooner if needed  with all medications  /inhalers/ solutions in hand   07/29/2023  f/u ov/Margaret Hunter re: cough x Nov 2023 c/w cough variant asthma vs  UACS maint on reflux rx   only p initial resolution of cough on pred  Chief Complaint  Patient presents with   Follow-up    Patient states her cough has not gone away. Pt has done a round of prednisone which helped, but the cough came back.    Dyspnea:  Not limited by breathing from desired activities   Cough: worse late in pm before hs  Sleeping: flat bed one pillow on side s  resp cc or am flare  SABA use: none  02: none    No obvious day to day or daytime variability or assoc excess/ purulent sputum or mucus plugs or hemoptysis or cp or chest tightness, subjective wheeze or overt sinus or hb symptoms.    Also denies any obvious fluctuation of symptoms with weather or environmental changes or other aggravating or alleviating factors except as outlined above   No unusual exposure hx or h/o childhood pna/ asthma or knowledge of premature birth.  Current Allergies, Complete Past Medical History, Past Surgical History, Family History, and Social History were reviewed in [employer]  electronic medical record.  ROS  The following are not active complaints unless bolded Hoarseness, sore throat= mild sense of globus dysphagia, dental problems, itching, sneezing,  nasal congestion or discharge of excess mucus or purulent secretions, ear ache,   fever, chills, sweats, unintended wt loss or wt gain, classically pleuritic or exertional cp,  orthopnea pnd or arm/hand swelling  or leg swelling, presyncope, palpitations, abdominal pain, anorexia, nausea, vomiting, diarrhea  or change in bowel habits or change in bladder habits, change in stools or change in urine, dysuria, hematuria,  rash, arthralgias, visual complaints, headache, numbness, weakness or ataxia or problems with walking or coordination,  change in mood or  memory.        Current Meds  Medication Sig   amLODipine (NORVASC) 2.5 MG  tablet Take 1 tablet (2.5 mg total) by mouth daily.   atorvastatin (LIPITOR) 20 MG tablet Take 1 tablet (20 mg total) by mouth daily.   benzonatate (TESSALON) 200 MG capsule Take 1 capsule (200 mg total) by mouth 3 (three) times daily as needed for cough.   Biotin 1000 MCG CHEW Chew 1 tablet by mouth in the morning and at bedtime.   cholecalciferol (VITAMIN D) 1000 units tablet Take 2,000 Units by mouth daily.   famotidine (PEPCID) 20 MG tablet One after supper   Multiple Vitamin (MULTIVITAMIN) tablet Take 1 tablet by mouth daily.   olmesartan-hydrochlorothiazide (BENICAR HCT) 40-25 MG tablet    pantoprazole (PROTONIX) 40 MG tablet Take 1 tablet (40 mg total) by mouth daily. Take 30-60 min before first meal of the day   predniSONE (DELTASONE) 10 MG tablet Take  4 each am x 2 days,   2 each am x 2 days,  1 each am x 2 days and stop          Past Medical History:  Diagnosis Date   Cancer (HCC) 01/2018   right breast cancer   Cholelithiasis    Family history of ovarian cancer    Hypercholesterolemia    Hypertension       Objective:    Wts   07/29/2023      192   06/30/23 191 lb 3.2 oz (86.7 kg)  12/10/22 189 lb 8 oz (86 kg)  10/03/22 184 lb (83.5 kg)      Vital signs reviewed  07/29/2023  - Note at rest 02 sats  98% on RA   General appearance:    amb wf nad/ no throat clearing   HEENT : Oropharynx  clear/ no cobblestoning or excess mucus      Nasal turbinates nl    NECK :  without  apparent JVD/ palpable Nodes/TM    LUNGS: no acc muscle use,  Nl contour chest which is clear to A and P bilaterally without cough on insp or exp maneuvers   CV:  RRR  no s3 or murmur or increase in P2, and no edema   ABD:  soft and nontender    MS:  Nl gait/ ext warm without deformities Or obvious joint restrictions  calf tenderness, cyanosis or clubbing    SKIN: warm and dry without lesions    NEURO:  alert, approp, nl sensorium with  no motor or cerebellar deficits apparent.          Assessment

## 2023-07-29 NOTE — Patient Instructions (Signed)
Pantoprazole (protonix) 40 mg   Take  30-60 min before first meal of the day and Pepcid (famotidine)  20 mg after supper until return to office - this is the best way to tell whether stomach acid is contributing to your problem.  Prednisone 10 mg take  4 each am x 2 days,   2 each am x 2 days,  1 each am x 2 days and stop    For cough or urge to cough  > add tessalon pearl  200 every 4-6 hours as needed (add to the candy)     Symbicort 80 1-2 puffs every 12 hours   Please schedule a follow up office visit in 6 weeks, call sooner if needed with all medications /inhalers/ solutions in hand so we can verify exactly what you are taking. This includes all medications from all doctors and over the counters

## 2023-07-29 NOTE — Assessment & Plan Note (Addendum)
Onset since Nov 2023  - cyclical cough rx 06/30/2023 >>> prednisone helped while on it plus another week  so 07/29/2023 repeat same but this time start symbicort 80 1-2 bid   - The proper method of use, as well as anticipated side effects, of a metered-dose inhaler were discussed and demonstrated to the patient using teach back method.          Each maintenance medication was reviewed in detail including emphasizing most importantly the difference between maintenance and prns and under what circumstances the prns are to be triggered using an action plan format where appropriate.  Total time for H and P, chart review, counseling, reviewing hfa device(s) and generating customized AVS unique to this office visit / same day charting = 30 min

## 2023-08-04 DIAGNOSIS — M1712 Unilateral primary osteoarthritis, left knee: Secondary | ICD-10-CM | POA: Diagnosis not present

## 2023-08-04 DIAGNOSIS — S83222D Peripheral tear of medial meniscus, current injury, left knee, subsequent encounter: Secondary | ICD-10-CM | POA: Diagnosis not present

## 2023-08-07 ENCOUNTER — Other Ambulatory Visit: Payer: Self-pay | Admitting: Family Medicine

## 2023-08-07 DIAGNOSIS — R1011 Right upper quadrant pain: Secondary | ICD-10-CM | POA: Diagnosis not present

## 2023-08-07 DIAGNOSIS — R109 Unspecified abdominal pain: Secondary | ICD-10-CM

## 2023-08-11 DIAGNOSIS — M1712 Unilateral primary osteoarthritis, left knee: Secondary | ICD-10-CM | POA: Diagnosis not present

## 2023-08-11 DIAGNOSIS — S83222D Peripheral tear of medial meniscus, current injury, left knee, subsequent encounter: Secondary | ICD-10-CM | POA: Diagnosis not present

## 2023-08-14 ENCOUNTER — Ambulatory Visit
Admission: RE | Admit: 2023-08-14 | Discharge: 2023-08-14 | Disposition: A | Discharge status: Home / Self Care | Payer: Medicare Other | Source: Ambulatory Visit | Attending: Family Medicine | Admitting: Family Medicine

## 2023-08-14 DIAGNOSIS — Z23 Encounter for immunization: Secondary | ICD-10-CM | POA: Diagnosis not present

## 2023-08-14 DIAGNOSIS — M810 Age-related osteoporosis without current pathological fracture: Secondary | ICD-10-CM | POA: Diagnosis not present

## 2023-08-14 DIAGNOSIS — R109 Unspecified abdominal pain: Secondary | ICD-10-CM | POA: Diagnosis not present

## 2023-08-14 DIAGNOSIS — K769 Liver disease, unspecified: Secondary | ICD-10-CM | POA: Diagnosis not present

## 2023-08-25 DIAGNOSIS — S83222D Peripheral tear of medial meniscus, current injury, left knee, subsequent encounter: Secondary | ICD-10-CM | POA: Diagnosis not present

## 2023-08-25 DIAGNOSIS — M1712 Unilateral primary osteoarthritis, left knee: Secondary | ICD-10-CM | POA: Diagnosis not present

## 2023-08-26 DIAGNOSIS — M810 Age-related osteoporosis without current pathological fracture: Secondary | ICD-10-CM | POA: Diagnosis not present

## 2023-08-27 ENCOUNTER — Ambulatory Visit: Payer: Medicare Other | Admitting: Internal Medicine

## 2023-09-01 ENCOUNTER — Telehealth: Payer: Self-pay | Admitting: Internal Medicine

## 2023-09-01 DIAGNOSIS — M1712 Unilateral primary osteoarthritis, left knee: Secondary | ICD-10-CM | POA: Diagnosis not present

## 2023-09-01 NOTE — Telephone Encounter (Signed)
Pt calling in bc she is doing everything she is supposed to do and is still coughing and having mucus in her chest. It has gotten better than it was originally . Wants to know about her getting another round of prednisone, or is there anything stronger than benzonatate (TESSALON) 200 MG capsule to suppress her coughing sensation.

## 2023-09-01 NOTE — Telephone Encounter (Signed)
Primary Pulmonologist: Wert Last office visit and with whom: 07/29/2023 Wert What do we see them for (pulmonary problems): upper airway cough syndrome Last OV assessment/plan:   Assessment & Plan Note by Nyoka Cowden, MD at 07/29/2023 7:29 PM  Author: Nyoka Cowden, MD Author Type: Physician Filed: 07/29/2023  7:30 PM  Note Status: Carlisle Cater: Cosign Not Required Encounter Date: 07/29/2023  Problem: Upper airway cough syndrome  Editor: Nyoka Cowden, MD (Physician)      Prior Versions: 1. Nyoka Cowden, MD (Physician) at 07/29/2023  7:29 PM - Written  Onset since Nov 2023  - cyclical cough rx 06/30/2023 >>> prednisone helped while on it plus another week  so 07/29/2023 repeat same but this time start symbicort 80 1-2 bid    - The proper method of use, as well as anticipated side effects, of a metered-dose inhaler were discussed and demonstrated to the patient using teach back method.            Each maintenance medication was reviewed in detail including emphasizing most importantly the difference between maintenance and prns and under what circumstances the prns are to be triggered using an action plan format where appropriate.   Total time for H and P, chart review, counseling, reviewing hfa device(s) and generating customized AVS unique to this office visit / same day charting = 30 min              Patient Instructions by Nyoka Cowden, MD at 07/29/2023 2:45 PM  Author: Nyoka Cowden, MD Author Type: Physician Filed: 07/29/2023  3:29 PM  Note Status: Signed Cosign: Cosign Not Required Encounter Date: 07/29/2023  Editor: Nyoka Cowden, MD (Physician)             Pantoprazole (protonix) 40 mg   Take  30-60 min before first meal of the day and Pepcid (famotidine)  20 mg after supper until return to office - this is the best way to tell whether stomach acid is contributing to your problem.   Prednisone 10 mg take  4 each am x 2 days,   2 each am x 2 days,  1 each am  x 2 days and stop      For cough or urge to cough  > add tessalon pearl  200 every 4-6 hours as needed (add to the candy)      Symbicort 80 1-2 puffs every 12 hours    Please schedule a follow up office visit in 6 weeks, call sooner if needed with all medications /inhalers/ solutions in hand so we can verify exactly what you are taking. This includes all medications from all doctors and over the counters        Orthostatic Vitals Recorded in This Encounter   07/29/2023 1509     Patient Position: Sitting  BP Location: Left Arm  Cuff Size: Normal   Instructions  Pantoprazole (protonix) 40 mg   Take  30-60 min before first meal of the day and Pepcid (famotidine)  20 mg after supper until return to office - this is the best way to tell whether stomach acid is contributing to your problem.   Prednisone 10 mg take  4 each am x 2 days,   2 each am x 2 days,  1 each am x 2 days and stop      For cough or urge to cough  > add tessalon pearl  200 every 4-6 hours as needed (add to the  candy)      Symbicort 80 1-2 puffs every 12 hours        Was appointment offered to patient (explain)?  No openings   Reason for call: Cough has gotten better since the last prednisone taper.  She states she is still coughing and the Tessalon pearles do not help.  She is requesting another round of prednisone and something stronger for the cough.  She is doing all of her inhalers as instructed and using jolly ranchers.  I advised her that I would get a message to Dr. Sherene Sires and it would be tomorrow before we would be able to get back to her as he is gone for the day.  She verbalized understanding.  Dr. Sherene Sires, please advise.  Thank you.  (examples of things to ask: : When did symptoms start? Fever? Cough? Productive? Color to sputum? More sputum than usual? Wheezing? Have you needed increased oxygen? Are you taking your respiratory medications? What over the counter measures have you tried?)  Allergies   Allergen Reactions   Penicillin G     Has patient had a PCN reaction causing immediate rash, facial/tongue/throat swelling, SOB or lightheadedness with hypotension: unkn Has patient had a PCN reaction causing severe rash involving mucus membranes or skin necrosis:  unkn Has patient had a PCN reaction that required hospitalization: unkn  Has patient had a PCN reaction occurring within the last 10 years: no  If all of the above answers are "NO", then may proceed with Cephalosporin use.      There is no immunization history on file for this patient.

## 2023-09-01 NOTE — Telephone Encounter (Signed)
Try delsym 2 tsp bid supplemented by tessalon 200 up to q 4h  and Prednisone 10 mg take  4 each am x 2 days,   2 each am x 2 days,  1 each am x 2 days and stop   If not better ov with all meds in hand to regroup .

## 2023-09-02 MED ORDER — BENZONATATE 200 MG PO CAPS: 200.0000 mg | ORAL_CAPSULE | ORAL | 1 refills | Status: DC | PRN

## 2023-09-02 MED ORDER — PREDNISONE 10 MG PO TABS
ORAL_TABLET | ORAL | 0 refills | Status: AC
Start: 2023-09-02 — End: 2023-09-08

## 2023-09-02 NOTE — Telephone Encounter (Signed)
Called and spoke with patient, advised of recommendations per Dr. Sherene Sires.  She verbalized understanding.  Medications sent to pharmacy.  Nothing further needed.

## 2023-09-03 DIAGNOSIS — Z170 Estrogen receptor positive status [ER+]: Secondary | ICD-10-CM | POA: Diagnosis not present

## 2023-09-03 DIAGNOSIS — C50211 Malignant neoplasm of upper-inner quadrant of right female breast: Secondary | ICD-10-CM | POA: Diagnosis not present

## 2023-09-05 DIAGNOSIS — M810 Age-related osteoporosis without current pathological fracture: Secondary | ICD-10-CM | POA: Diagnosis not present

## 2023-09-26 ENCOUNTER — Other Ambulatory Visit: Payer: Self-pay | Admitting: Internal Medicine

## 2023-09-26 DIAGNOSIS — R058 Other specified cough: Secondary | ICD-10-CM

## 2023-09-29 ENCOUNTER — Ambulatory Visit: Payer: Medicare Other | Admitting: Primary Care

## 2023-10-27 ENCOUNTER — Ambulatory Visit (INDEPENDENT_AMBULATORY_CARE_PROVIDER_SITE_OTHER): Payer: Medicare Other | Admitting: Primary Care

## 2023-10-27 ENCOUNTER — Encounter: Payer: Self-pay | Admitting: Primary Care

## 2023-10-27 VITALS — BP 123/58 | HR 62 | Temp 97.4°F | Ht 62.0 in | Wt 189.6 lb

## 2023-10-27 DIAGNOSIS — R058 Other specified cough: Secondary | ICD-10-CM

## 2023-10-27 LAB — POCT EXHALED NITRIC OXIDE: FeNO level (ppb): 12

## 2023-10-27 MED ORDER — IPRATROPIUM BROMIDE 0.03 % NA SOLN: 2.0000 | Freq: Two times a day (BID) | NASAL | 12 refills | Status: DC

## 2023-10-27 MED ORDER — PROMETHAZINE-DM 6.25-15 MG/5ML PO SYRP: 5.0000 mL | ORAL_SOLUTION | Freq: Four times a day (QID) | ORAL | 0 refills | Status: DC | PRN

## 2023-10-27 NOTE — Patient Instructions (Addendum)
  Ipratropium Bromide Nasal Spray 0.03% Nasal Solution 30 mL (345 Metered  Sprays) Ipratropium nasal spray is a medication that treats a runny nose caused by allergies or other conditions. It works by reducing the amount of mucus produced by the nose.   Promethazine is used to relieve or prevent the symptoms of hay fever, allergic conjunctivitis (inflammation of the eye), and other types of allergy or allergic reactions. It works by preventing the effects of a substance called histamine, which is produced by the body.  Recommendations: Promethazine-dm cough syrup every 6 hours for cough  Ipratropium (Atrovent) nasal spray twice daily  Continue Symbicort 2 puffs moring and evening Continue Pantoprazole and Pepcid as directed   Orders: FENO  re: cough (if <25 normal, no changes to above plan)  Follow-up: 8 weeks with Waynetta Sandy NP or Dr. Sherene Sires

## 2023-10-27 NOTE — Progress Notes (Signed)
@Patient  ID: Margaret Hunter, female    DOB: 1946/04/05, 78 y.o.   MRN: [id number]  No chief complaint on file.   Referring provider: Laurann Montana, MD  HPI:  Brief patient profile:  25  yowf never smoker never problem until 70s coughs tended to  linger p uri but did not require rx referred to pulmonary clinic 06/30/2023 by Dr Margaret Hunter  for bad flu-like iillness  Nov 2023 with cough ever exac with another ? viral URI Jan 2024 and even worse since   Rx march 2024 with albuterol/ nasal spray ? Maybe some better p prednisone and singulair but persisted. Omeprazole some better Allergist/whelan > dust/ mold rx trelegy / nasal spray and antihistamine some better Margaret Hunter added dpi> Worse   Note every intervention ranked as some better x dpi which made it worse   History of Present Illness  06/30/2023  Pulmonary/ 1st office eval/Margaret Hunter  Chief Complaint  Patient presents with   Consult    Cough ( 9 months and dry ) pt has been on prednisone and antibiotics. pt had a ct which showed nodules  but was not on the lungs. Has tried reflex meds, has tried albuterol inhaler. Pt seen a allergist   Dyspnea:  Not limited by breathing from desired activities  / limited by L knee but but prior to this no problem Cough: severe coughing fits mostly dry with sensation of globus / using lots of halls seems to helps but only while has one in mouth  Worse with voice use  Sleep: flat bed bid pillow on L side maybe once a week  SABA use: none  Rec Upper airway cough syndrome = Margaret Hunter  Stop fosamax , trelegy, nasal spray, levocertizine  Pantoprazole (protonix) 40 mg   Take  30-60 min before first meal of the day and Pepcid (famotidine)  20 mg after supper until return to office GERD diet reviewed, bed blocks rec   Prednisone 10 mg take  4 each am x 2 days,   2 each am x 2 days,  1 each am x 2 days and stop  For cough or urge to cough  > add tessalon pearl  200 every 4-6 hours as needed (add to the candy)   Please schedule a follow up office visit in 4-6  weeks, sooner if needed  with all medications /inhalers/ solutions in hand   07/29/2023  f/u ov/Margaret Hunter re: cough x Nov 2023 c/w cough variant asthma vs  UACS maint on reflux rx   only p initial resolution of cough on pred  Chief Complaint  Patient presents with   Follow-up    Patient states her cough has not gone away. Pt has done a round of prednisone which helped, but the cough came back.    Dyspnea:  Not limited by breathing from desired activities   Cough: worse late in pm before hs  Sleeping: flat bed one pillow on side s  resp cc or am flare  SABA use: none  02: none    No obvious day to day or daytime variability or assoc excess/ purulent sputum or mucus plugs or hemoptysis or cp or chest tightness, subjective wheeze or overt sinus or hb symptoms.    Also denies any obvious fluctuation of symptoms with weather or environmental changes or other aggravating or alleviating factors except as outlined above   No unusual exposure hx or h/o childhood pna/ asthma or knowledge of premature birth.  Current Allergies, Complete Past  Medical History, Past Surgical History, Family History, and Social History were reviewed in Margaret Hunter record.  ROS  The following are not active complaints unless bolded Hoarseness, sore throat= mild sense of globus dysphagia, dental problems, itching, sneezing,  nasal congestion or discharge of excess mucus or purulent secretions, ear ache,   fever, chills, sweats, unintended wt loss or wt gain, classically pleuritic or exertional cp,  orthopnea pnd or arm/hand swelling  or leg swelling, presyncope, palpitations, abdominal pain, anorexia, nausea, vomiting, diarrhea  or change in bowel habits or change in bladder habits, change in stools or change in urine, dysuria, hematuria,  rash, arthralgias, visual complaints, headache, numbness, weakness or ataxia or problems with walking or coordination,   change in mood or  memory.       10/27/2023- Interim hx  Patient presents today for 6 week follow-up/ UACS.   Discussed the use of AI scribe software for clinical note transcription with the patient, who gave verbal consent to proceed.  History of Present Illness   The patient, previously diagnosed with an upper airway cough, has been under treatment with pantoprazole, Pepcid, a prednisone taper, Tessalon Perles, and Symbicort 1-2 puffs morning and evening. Despite these interventions, the patient reports persistent symptoms, particularly mucus production and coughing. The patient describes the cough as originating more from the throat than the bronchial areas, unlike the initial presentation. She expresses a need for a medication to reduce mucus and a stronger cough suppressant, as she does not perceive significant relief from Tessalon Perles or Delsym cough syrup.   The patient has also been trialing acid reflux medication, but does not believe it is effective, occasionally feeling slightly nauseated post-administration. She has been adhering to dietary restrictions to manage potential acid reflux, but does not believe this to be the primary issue. The patient has also tried Mucinex, but did not notice a significant improvement. She has a history of flu twice a year and a half ago, which was when the cough initially started.  The patient has been using Symbicort (two puffs twice daily) and finds prednisone to be the most effective in managing symptoms, although she dislikes its side effects. She reports no coughing when lying down at night and sometimes feels that walking around reduces coughing. The patient has a history of scarring in the lungs, as revealed by a CT scan, but no nodules were found.     Allergies  Allergen Reactions   Penicillin G     Has patient had a PCN reaction causing immediate rash, facial/tongue/throat swelling, SOB or lightheadedness with hypotension: unkn Has  patient had a PCN reaction causing severe rash involving mucus membranes or skin necrosis:  unkn Has patient had a PCN reaction that required hospitalization: unkn  Has patient had a PCN reaction occurring within the last 10 years: no  If all of the above answers are "NO", then may proceed with Cephalosporin use.      There is no immunization history on file for this patient.  Past Medical History:  Diagnosis Date   Cancer (HCC) 01/2018   right breast cancer   Cholelithiasis    Family history of ovarian cancer    Hypercholesterolemia    Hypertension     Tobacco History: Social History   Tobacco Use  Smoking Status Never  Smokeless Tobacco Never   Counseling given: Not Answered   Outpatient Medications Prior to Visit  Medication Sig Dispense Refill   amLODipine (NORVASC) 2.5 MG tablet Take  1 tablet (2.5 mg total) by mouth daily.     atorvastatin (LIPITOR) 20 MG tablet Take 1 tablet (20 mg total) by mouth daily.     benzonatate (TESSALON) 200 MG capsule Take 1 capsule (200 mg total) by mouth 3 (three) times daily as needed for cough. 30 capsule 1   benzonatate (TESSALON) 200 MG capsule Take 1 capsule (200 mg total) by mouth every 4 (four) hours as needed for cough (up to every 4 hours prn). 30 capsule 1   Biotin 1000 MCG CHEW Chew 1 tablet by mouth in the morning and at bedtime. 30 tablet    budesonide-formoterol (SYMBICORT) 80-4.5 MCG/ACT inhaler Take 2 puffs first thing in am and then another 2 puffs about 12 hours later. 1 each 12   cholecalciferol (VITAMIN D) 1000 units tablet Take 2,000 Units by mouth daily.     famotidine (PEPCID) 20 MG tablet One after supper 30 tablet 11   Multiple Vitamin (MULTIVITAMIN) tablet Take 1 tablet by mouth daily.     olmesartan-hydrochlorothiazide (BENICAR HCT) 40-25 MG tablet      pantoprazole (PROTONIX) 40 MG tablet TAKE 1 TABLET(40 MG) BY MOUTH DAILY 30 TO 60 MINUTES BEFORE FIRST MEAL OF THE DAY 30 tablet 2   No facility-administered  medications prior to visit.   Review of Systems  Review of Systems  Constitutional: Negative.   HENT: Negative.    Respiratory:  Positive for cough.   Cardiovascular: Negative.      Physical Exam  There were no vitals taken for this visit. Physical Exam Constitutional:      Appearance: Normal appearance.  HENT:     Head: Normocephalic and atraumatic.     Mouth/Throat:     Mouth: Mucous membranes are moist.     Pharynx: Oropharynx is clear.  Cardiovascular:     Rate and Rhythm: Normal rate and regular rhythm.  Pulmonary:     Effort: Pulmonary effort is normal.     Breath sounds: Normal breath sounds.  Skin:    General: Skin is warm and dry.  Neurological:     General: No focal deficit present.     Mental Status: She is alert and oriented to person, place, and time. Mental status is at baseline.  Psychiatric:        Mood and Affect: Mood normal.        Behavior: Behavior normal.        Thought Content: Thought content normal.        Judgment: Judgment normal.      Lab Results:  CBC    Component Value Date/Time   WBC 8.2 02/04/2018 0829   RBC 4.65 02/04/2018 0829   HGB 13.2 02/04/2018 0829   HCT 40.4 02/04/2018 0829   PLT 252 02/04/2018 0829   MCV 86.8 02/04/2018 0829   MCH 28.4 02/04/2018 0829   MCHC 32.8 02/04/2018 0829   RDW 14.7 (H) 02/04/2018 0829   LYMPHSABS 1.6 02/04/2018 0829   MONOABS 0.7 02/04/2018 0829   EOSABS 0.1 02/04/2018 0829   BASOSABS 0.0 02/04/2018 0829    BMET    Component Value Date/Time   NA 140 05/01/2018 1400   K 3.7 05/01/2018 1400   CL 104 05/01/2018 1400   CO2 27 05/01/2018 1400   GLUCOSE 118 (H) 05/01/2018 1400   BUN 20 05/01/2018 1400   CREATININE 0.66 05/01/2018 1400   CREATININE 0.77 02/04/2018 0829   CALCIUM 10.4 (H) 05/01/2018 1400   GFRNONAA >60 05/01/2018 1400  GFRNONAA >60 02/04/2018 0829   GFRAA >60 05/01/2018 1400   GFRAA >60 02/04/2018 0829    BNP No results found for: "BNP"  ProBNP No results  found for: "PROBNP"  Imaging: No results found.   Assessment & Plan:   1. Upper airway cough syndrome (Primary) - POCT EXHALED NITRIC OXIDE     Upper Airway Cough Syndrome Persistent cough despite treatment with pantoprazole, Pepcid, prednisone, Tessalon Perles, and Symbicort. The cough has shifted from a bronchial to a throat-based cough, suggesting a possible postnasal drip component. The patient reports no significant improvement with acid reflux medication and Tessalon Perles. -Continue pantoprazole and Pepcid for now. -Prescribe Ipatropium nasal spray to reduce PND/mucus production. -Prescribe Promethazine DM as a stronger cough suppressant. -Reevaluate the need for acid reflux medication once the cough is controlled. -Follow-up    Asthma Patient is currently on Symbicort 2 puffs twice daily. The effectiveness of this treatment is uncertain. She does respond temporarily to oral prednisone, but improvement does not last.  -FENO testing today was normal (12)      Glenford Bayley, NP 10/27/2023

## 2023-11-10 ENCOUNTER — Other Ambulatory Visit: Payer: Self-pay | Admitting: Primary Care

## 2023-11-27 ENCOUNTER — Other Ambulatory Visit: Payer: Self-pay | Admitting: Primary Care

## 2023-12-01 DIAGNOSIS — M170 Bilateral primary osteoarthritis of knee: Secondary | ICD-10-CM | POA: Diagnosis not present

## 2023-12-02 ENCOUNTER — Telehealth: Payer: Self-pay | Admitting: Primary Care

## 2023-12-02 MED ORDER — PROMETHAZINE-DM 6.25-15 MG/5ML PO SYRP: 5.0000 mL | ORAL_SOLUTION | Freq: Four times a day (QID) | ORAL | 0 refills | Status: DC | PRN

## 2023-12-02 NOTE — Telephone Encounter (Signed)
 LOV 10/27/23 Next OV 12/22/23  Last refill 11/11/23, #[id number], 0 refills  Please review, thanks!

## 2023-12-02 NOTE — Telephone Encounter (Signed)
 promethazine-dextromethorphan (PROMETHAZINE-DM) 6.25-15 MG/5ML syrup [[id number]]   Needs more cough syrup. She is out. First requested last Wednesday. Pharm is Therapist, [facility] on [address] and [address].

## 2023-12-02 NOTE — Telephone Encounter (Signed)
 Sent in refill

## 2023-12-03 DIAGNOSIS — I10 Essential (primary) hypertension: Secondary | ICD-10-CM | POA: Diagnosis not present

## 2023-12-03 DIAGNOSIS — E6609 Other obesity due to excess calories: Secondary | ICD-10-CM | POA: Diagnosis not present

## 2023-12-03 DIAGNOSIS — R232 Flushing: Secondary | ICD-10-CM | POA: Diagnosis not present

## 2023-12-03 DIAGNOSIS — M8588 Other specified disorders of bone density and structure, other site: Secondary | ICD-10-CM | POA: Diagnosis not present

## 2023-12-03 DIAGNOSIS — E785 Hyperlipidemia, unspecified: Secondary | ICD-10-CM | POA: Diagnosis not present

## 2023-12-03 DIAGNOSIS — R21 Rash and other nonspecific skin eruption: Secondary | ICD-10-CM | POA: Diagnosis not present

## 2023-12-03 DIAGNOSIS — R058 Other specified cough: Secondary | ICD-10-CM | POA: Diagnosis not present

## 2023-12-04 ENCOUNTER — Other Ambulatory Visit: Payer: Self-pay | Admitting: Internal Medicine

## 2023-12-04 DIAGNOSIS — R058 Other specified cough: Secondary | ICD-10-CM

## 2023-12-10 ENCOUNTER — Inpatient Hospital Stay: Payer: Medicare Other | Attending: Adult Health | Admitting: Adult Health

## 2023-12-10 ENCOUNTER — Encounter: Payer: Self-pay | Admitting: Adult Health

## 2023-12-10 VITALS — BP 138/51 | HR 76 | Temp 97.7°F | Resp 18 | Ht 62.0 in | Wt 189.3 lb

## 2023-12-10 DIAGNOSIS — R053 Chronic cough: Secondary | ICD-10-CM | POA: Insufficient documentation

## 2023-12-10 DIAGNOSIS — Z79899 Other long term (current) drug therapy: Secondary | ICD-10-CM | POA: Insufficient documentation

## 2023-12-10 DIAGNOSIS — M179 Osteoarthritis of knee, unspecified: Secondary | ICD-10-CM | POA: Diagnosis not present

## 2023-12-10 DIAGNOSIS — Z9049 Acquired absence of other specified parts of digestive tract: Secondary | ICD-10-CM | POA: Diagnosis not present

## 2023-12-10 DIAGNOSIS — Z170 Estrogen receptor positive status [ER+]: Secondary | ICD-10-CM | POA: Diagnosis not present

## 2023-12-10 DIAGNOSIS — Z1721 Progesterone receptor positive status: Secondary | ICD-10-CM | POA: Insufficient documentation

## 2023-12-10 DIAGNOSIS — C50211 Malignant neoplasm of upper-inner quadrant of right female breast: Secondary | ICD-10-CM | POA: Diagnosis not present

## 2023-12-10 DIAGNOSIS — Z8049 Family history of malignant neoplasm of other genital organs: Secondary | ICD-10-CM | POA: Diagnosis not present

## 2023-12-10 DIAGNOSIS — Z1732 Human epidermal growth factor receptor 2 negative status: Secondary | ICD-10-CM | POA: Diagnosis not present

## 2023-12-10 DIAGNOSIS — S83207A Unspecified tear of unspecified meniscus, current injury, left knee, initial encounter: Secondary | ICD-10-CM | POA: Insufficient documentation

## 2023-12-10 DIAGNOSIS — Z806 Family history of leukemia: Secondary | ICD-10-CM | POA: Insufficient documentation

## 2023-12-10 DIAGNOSIS — Z8041 Family history of malignant neoplasm of ovary: Secondary | ICD-10-CM | POA: Insufficient documentation

## 2023-12-10 DIAGNOSIS — Z8249 Family history of ischemic heart disease and other diseases of the circulatory system: Secondary | ICD-10-CM | POA: Diagnosis not present

## 2023-12-10 NOTE — Progress Notes (Unsigned)
 [facility] Cancer Follow up:    Laurann [place], MD [phone] [address] [place] [address]   DIAGNOSIS: Cancer Staging  Malignant neoplasm of upper-inner quadrant of right breast in female, estrogen receptor positive (HCC) Staging form: Breast, AJCC 8th Edition - Clinical stage from 02/04/2018: Stage IA (cT1a, cN0, cM0, G1, ER+, PR+, HER2-) - Unsigned Histologic grading system: 3 grade system Laterality: Right Stage used in treatment planning: Yes National guidelines used in treatment planning: Yes Type of national guideline used in treatment planning: NCCN   SUMMARY OF ONCOLOGIC HISTORY: Oncology History  Malignant neoplasm of upper-inner quadrant of right breast in female, estrogen receptor positive (HCC)  01/30/2018 Initial Diagnosis   Screening detected right breast mass 0.5 cm at 1 o'clock position 9 cm from nipple, axilla negative biopsy: Invasive ductal carcinoma and a papillary lesion grade 1-2, ER PR positive HER-2 negative Ki-67 20%, T1 a N0 stage I a clinical stage AJCC 8   02/13/2018 Genetic Testing   The Common Hereditary Cancers Panel + Myelodysplastic Syndrome/Leukemia Panel was ordered (54 genes) The following genes were evaluated for sequence changes and exonic deletions/duplications: APC, ATM, AXIN2, BARD1, BLM, BMPR1A, BRCA1, BRCA2, BRIP1, CDH1, CDK4, CDKN2A (p14ARF), CDKN2A (p16INK4a), CEBPA, CHEK2, CTNNA1, DICER1, EPCAM*, GATA2, GREM1*, HRAS, KIT, MEN1, MLH1, MSH2, MSH3, MSH6, MUTYH, NBN, NF1, PALB2, PDGFRA, PMS2, POLD1, POLE, PTEN, RAD50, RAD51C, RAD51D, RUNX1, SDHB, SDHC, SDHD, SMAD4, SMARCA4, STK11, TERC, TERT, TP53, TSC1, TSC2, VHL The following genes were evaluated for sequence changes only:HOXB13*, NTHL1*, SDHA  Results: Negative, no pathogenic variants identified.  The date of this test report is 02/13/2018.    02/20/2018 Surgery   Right lumpectomy: IDC with DCIS, 0.6 cm, grade 1, 0/2 lymph nodes negative, T1 b N0 stage Ia, ER 100%, PR  95%, HER-2 negative ratio 1.24   03/29/2018 -  Anti-estrogen oral therapy   Letrozole 2.5 mg daily     CURRENT THERAPY:  INTERVAL HISTORY: KORTNEY POTVIN 78 y.o. female returns for    Patient Active Problem List   Diagnosis Date Noted   Upper airway cough syndrome 06/30/2023   Osteoarthritis of thumb 05/25/2022   Mallet finger of right hand 05/25/2022   Trigger thumb of right hand 11/09/2019   Right shoulder pain 11/09/2019   Genetic testing 02/17/2018   Family history of ovarian cancer    Malignant neoplasm of upper-inner quadrant of right breast in female, estrogen receptor positive (HCC) 01/30/2018    is allergic to penicillin g.  MEDICAL HISTORY: Past Medical History:  Diagnosis Date   Cancer (HCC) 01/2018   right breast cancer   Cholelithiasis    Family history of ovarian cancer    Hypercholesterolemia    Hypertension     SURGICAL HISTORY: Past Surgical History:  Procedure Laterality Date   BREAST LUMPECTOMY WITH RADIOACTIVE SEED AND SENTINEL LYMPH NODE BIOPSY Right 02/20/2018   Procedure: RIGHT BREAST LUMPECTOMY WITH RADIOACTIVE SEED AND RIGHT AXILLARY SENTINEL LYMPH NODE BIOPSY;  Surgeon: Glenna Fellows, MD;  Location: [facility];  Service: General;  Laterality: Right;   CESAREAN SECTION     CHOLECYSTECTOMY N/A 05/08/2018   Procedure: LAPAROSCOPIC CHOLECYSTECTOMY WITH INTRAOPERATIVE CHOLANGIOGRAM;  Surgeon: Glenna Fellows, MD;  Location: [facility] OR;  Service: General;  Laterality: N/A;   HERNIA REPAIR Right    [facility]    SOCIAL HISTORY: Social History   Socioeconomic History   Marital status: Married    Spouse name: Brett Canales   Number of children: 3  Years of education: Not on file   Highest education level: Bachelor's degree (e.g., BA, AB, BS)  Occupational History   Occupation: Film/video editor  Tobacco Use   Smoking status: Never   Smokeless tobacco: Never  Substance and Sexual Activity   Alcohol use: Yes    Comment: social   Drug use:  Never   Sexual activity: Not on file  Other Topics Concern   Not on file  Social History Narrative   Not on file   Social Drivers of Health   Financial Resource Strain: Low Risk  (05/07/2018)   Overall Financial Resource Strain (CARDIA)    Difficulty of Paying Living Expenses: Not hard at all  Food Insecurity: No Food Insecurity (05/07/2018)   Hunger Vital Sign    Worried About Running Out of Food in the Last Year: Never true    Ran Out of Food in the Last Year: Never true  Transportation Needs: No Transportation Needs (05/07/2018)   PRAPARE - Administrator, Civil Service (Medical): No    Lack of Transportation (Non-Medical): No  Physical Activity: Sufficiently Active (05/07/2018)   Exercise Vital Sign    Days of Exercise per Week: 4 days    Minutes of Exercise per Session: 40 min  Stress: No Stress Concern Present (05/07/2018)   [employer] of Occupational Health - Occupational Stress Questionnaire    Feeling of Stress : Not at all  Social Connections: Socially Integrated (05/07/2018)   Social Connection and Isolation Panel [NHANES]    Frequency of Communication with Friends and Family: More than three times a week    Frequency of Social Gatherings with Friends and Family: More than three times a week    Attends Religious Services: More than 4 times per year    Active Member of Clubs or Organizations: Yes    Attends Banker Meetings: More than 4 times per year    Marital Status: Married  Catering manager Violence: Not At Risk (05/07/2018)   Humiliation, Afraid, Rape, and Kick questionnaire    Fear of Current or Ex-Partner: No    Emotionally Abused: No    Physically Abused: No    Sexually Abused: No    FAMILY HISTORY: Family History  Problem Relation Age of Onset   Uterine cancer Mother        found some cancer cells in uterus - was told it was 'hormone induced'   Ovarian cancer Sister 28       caught early (found incidentially during surgery), had  chemo   Leukemia Brother 5   Heart disease Maternal Grandmother 62    Review of Systems - Oncology    PHYSICAL EXAMINATION    Vitals:   12/10/23 1050  BP: (!) 138/51  Pulse: 76  Resp: 18  Temp: 97.7 F (36.5 C)  SpO2: 97%    Physical Exam  LABORATORY DATA:  CBC    Component Value Date/Time   WBC 8.2 02/04/2018 0829   RBC 4.65 02/04/2018 0829   HGB 13.2 02/04/2018 0829   HCT 40.4 02/04/2018 0829   PLT 252 02/04/2018 0829   MCV 86.8 02/04/2018 0829   MCH 28.4 02/04/2018 0829   MCHC 32.8 02/04/2018 0829   RDW 14.7 (H) 02/04/2018 0829   LYMPHSABS 1.6 02/04/2018 0829   MONOABS 0.7 02/04/2018 0829   EOSABS 0.1 02/04/2018 0829   BASOSABS 0.0 02/04/2018 0829    CMP     Component Value Date/Time   NA 140 05/01/2018 1400  K 3.7 05/01/2018 1400   CL 104 05/01/2018 1400   CO2 27 05/01/2018 1400   GLUCOSE 118 (H) 05/01/2018 1400   BUN 20 05/01/2018 1400   CREATININE 0.66 05/01/2018 1400   CREATININE 0.77 02/04/2018 0829   CALCIUM 10.4 (H) 05/01/2018 1400   PROT 7.0 02/04/2018 0829   ALBUMIN 3.9 02/04/2018 0829   AST 14 02/04/2018 0829   ALT 16 02/04/2018 0829   ALKPHOS 101 02/04/2018 0829   BILITOT 0.5 02/04/2018 0829   GFRNONAA >60 05/01/2018 1400   GFRNONAA >60 02/04/2018 0829   GFRAA >60 05/01/2018 1400   GFRAA >60 02/04/2018 0829       PENDING LABS:   RADIOGRAPHIC STUDIES:  No results found.   PATHOLOGY:     ASSESSMENT and THERAPY PLAN:   No problem-specific Assessment & Plan notes found for this encounter.   No orders of the defined types were placed in this encounter.   All questions were answered. The patient knows to call the clinic with any problems, questions or concerns. We can certainly see the patient much sooner if necessary. This note was electronically signed. Noreene Filbert, NP 12/10/2023

## 2023-12-12 NOTE — Assessment & Plan Note (Signed)
 Breast cancer survivorship No malignancy on July 2024 mammogram. C category A. No breast concerns or recurrence worry. Comfortable with follow-up plan. - Continue annual mammograms. - Return for evaluation if any breast changes are noticed.  Upper airway cough syndrome Persistent cough attributed to upper airway cough syndrome. Managed with Claritin and humidifier. Alendronate switched to ibandronate due to cough. Under pulmonology care. - Continue ibandronate 150 mg once a month. - Use Claritin and a humidifier as needed to manage symptoms. - Follow up with pulmonologist in a few weeks.  Knee osteoarthritis with meniscus tear Torn meniscus in left knee causing difficulty walking. Corticosteroid injections provided relief. Engages in physical activities for health maintenance. - Continue current physical activities for weight management and joint health. - Monitor knee symptoms and consider further interventions if symptoms worsen.  RTC PRN

## 2023-12-22 ENCOUNTER — Ambulatory Visit: Payer: Medicare Other | Admitting: Primary Care

## 2023-12-25 ENCOUNTER — Other Ambulatory Visit: Payer: Self-pay | Admitting: Primary Care

## 2023-12-25 NOTE — Telephone Encounter (Signed)
 Copied from CRM [id number]. Topic: Clinical - Medication Refill >> Dec 25, 2023  9:50 AM Isabell A wrote: Most Recent Primary Care Visit:   Medication: promethazine-dextromethorphan (PROMETHAZINE-DM) 6.25-15 MG/5ML syrup  Has the patient contacted their pharmacy? Yes (Agent: If no, request that the patient contact the pharmacy for the refill. If patient does not wish to contact the pharmacy document the reason why and proceed with request.) (Agent: If yes, when and what did the pharmacy advise?)  Is this the correct pharmacy for this prescription? Yes If no, delete pharmacy and type the correct one.  This is the patient's preferred pharmacy:  [id number] [place], [place] - [address] AT [facility] OF [facility] [address] & [facility] [address] [place] [address] Phone: [phone] Fax: [phone]   Has the prescription been filled recently? Yes  Is the patient out of the medication? No  Has the patient been seen for an appointment in the last year OR does the patient have an upcoming appointment? Yes  Can we respond through MyChart? No  Agent: Please be advised that Rx refills may take up to 3 business days. We ask that you follow-up with your pharmacy.

## 2023-12-26 MED ORDER — PROMETHAZINE-DM 6.25-15 MG/5ML PO SYRP: 5.0000 mL | ORAL_SOLUTION | Freq: Four times a day (QID) | ORAL | 0 refills | Status: DC | PRN

## 2024-01-23 DIAGNOSIS — Z23 Encounter for immunization: Secondary | ICD-10-CM | POA: Diagnosis not present

## 2024-01-28 DIAGNOSIS — C50311 Malignant neoplasm of lower-inner quadrant of right female breast: Secondary | ICD-10-CM | POA: Diagnosis not present

## 2024-01-28 DIAGNOSIS — E6609 Other obesity due to excess calories: Secondary | ICD-10-CM | POA: Diagnosis not present

## 2024-01-28 DIAGNOSIS — E785 Hyperlipidemia, unspecified: Secondary | ICD-10-CM | POA: Diagnosis not present

## 2024-01-28 DIAGNOSIS — I10 Essential (primary) hypertension: Secondary | ICD-10-CM | POA: Diagnosis not present

## 2024-02-02 ENCOUNTER — Encounter: Payer: Self-pay | Admitting: Primary Care

## 2024-02-02 ENCOUNTER — Ambulatory Visit: Admitting: Primary Care

## 2024-02-02 DIAGNOSIS — R053 Chronic cough: Secondary | ICD-10-CM | POA: Diagnosis not present

## 2024-02-02 DIAGNOSIS — K219 Gastro-esophageal reflux disease without esophagitis: Secondary | ICD-10-CM

## 2024-02-02 DIAGNOSIS — R058 Other specified cough: Secondary | ICD-10-CM

## 2024-02-02 DIAGNOSIS — J309 Allergic rhinitis, unspecified: Secondary | ICD-10-CM

## 2024-02-02 MED ORDER — PROMETHAZINE-DM 6.25-15 MG/5ML PO SYRP: 5.0000 mL | ORAL_SOLUTION | Freq: Four times a day (QID) | ORAL | 0 refills | Status: DC | PRN

## 2024-02-02 MED ORDER — FAMOTIDINE 20 MG PO TABS: ORAL_TABLET | ORAL | 11 refills | Status: DC

## 2024-02-02 MED ORDER — FEXOFENADINE HCL 180 MG PO TABS: 180.0000 mg | ORAL_TABLET | Freq: Every day | ORAL | 5 refills | Status: DC

## 2024-02-02 NOTE — Patient Instructions (Addendum)
 CT chest last year looked fine lung wise   Start extra strength mucinex 1,200mg  twice daily with a glass of water (next 1-2 weeks then as needed) Start Allegra daily Continue promethazine -dm every 6 hours as needed for cough  Continue ipratropium (Atrovent ) nasal spray twice daily  Continue Famotidine  (Pepcid ) at bedtime  Stop Symbicort   Stop Pantoprazole    Follow-up 3 months with Beth NP or Dr. Waymond Hailey

## 2024-02-02 NOTE — Progress Notes (Signed)
 @Patient  ID: Margaret Hunter, female    DOB: 09/25/1946, 78 y.o.   MRN: [id number]  No chief complaint on file.   Referring provider: Victorio Grave, MD  HPI: 78 year old female, never smoked.  Past medical history significant for upper airway cough syndrome, breast cancer, osteoarthritis.  Brief patient profile:  18  yowf never smoker never problem until 70s coughs tended to  linger p uri but did not require rx referred to pulmonary clinic 06/30/2023 by Dr Margaret Hunter  for bad flu-like iillness  Nov 2023 with cough ever exac with another ? viral URI Jan 2024 and even worse since   Rx march 2024 with albuterol/ nasal spray ? Maybe some better p prednisone  and singulair but persisted. Omeprazole some better Allergist/whelan > dust/ mold rx trelegy / nasal spray and antihistamine some better Margaret Hunter added dpi> Worse   Note every intervention ranked as some better x dpi which made it worse   History of Present Illness  06/30/2023  Pulmonary/ 1st office eval/Margaret Hunter  Chief Complaint  Patient presents with   Consult    Cough ( 9 months and dry ) pt has been on prednisone  and antibiotics. pt had a ct which showed nodules  but was not on the lungs. Has tried reflex meds, has tried albuterol inhaler. Pt seen a allergist   Dyspnea:  Not limited by breathing from desired activities  / limited by L knee but but prior to this no problem Cough: severe coughing fits mostly dry with sensation of globus / using lots of halls seems to helps but only while has one in mouth  Worse with voice use  Sleep: flat bed bid pillow on L side maybe once a week  SABA use: none  Rec Upper airway cough syndrome = Margaret Hunter  Stop fosamax , trelegy, nasal spray, levocertizine  Pantoprazole  (protonix ) 40 mg   Take  30-60 min before first meal of the day and Pepcid  (famotidine )  20 mg after supper until return to office GERD diet reviewed, bed blocks rec   Prednisone  10 mg take  4 each am x 2 days,   2 each am x 2 days,  1  each am x 2 days and stop  For cough or urge to cough  > add tessalon  pearl  200 every 4-6 hours as needed (add to the candy)  Please schedule a follow up office visit in 4-6  weeks, sooner if needed  with all medications /inhalers/ solutions in hand   07/29/2023  f/u ov/Margaret Hunter re: cough x Nov 2023 c/w cough variant asthma vs  UACS maint on reflux rx   only p initial resolution of cough on pred  Chief Complaint  Patient presents with   Follow-up    Patient states her cough has not gone away. Pt has done a round of prednisone  which helped, but the cough came back.    Dyspnea:  Not limited by breathing from desired activities   Cough: worse late in pm before hs  Sleeping: flat bed one pillow on side s  resp cc or am flare  SABA use: none  02: none    No obvious day to day or daytime variability or assoc excess/ purulent sputum or mucus plugs or hemoptysis or cp or chest tightness, subjective wheeze or overt sinus or hb symptoms.    Also denies any obvious fluctuation of symptoms with weather or environmental changes or other aggravating or alleviating factors except as outlined above   No unusual  exposure hx or h/o childhood pna/ asthma or knowledge of premature birth.  Current Allergies, Complete Past Medical History, Past Surgical History, Family History, and Social History were reviewed in [name] record.  ROS  The following are not active complaints unless bolded Hoarseness, sore throat= mild sense of globus dysphagia, dental problems, itching, sneezing,  nasal congestion or discharge of excess mucus or purulent secretions, ear ache,   fever, chills, sweats, unintended wt loss or wt gain, classically pleuritic or exertional cp,  orthopnea pnd or arm/hand swelling  or leg swelling, presyncope, palpitations, abdominal pain, anorexia, nausea, vomiting, diarrhea  or change in bowel habits or change in bladder habits, change in stools or change in urine, dysuria,  hematuria,  rash, arthralgias, visual complaints, headache, numbness, weakness or ataxia or problems with walking or coordination,  change in mood or  memory.       10/27/2023 Patient presents today for 6 week follow-up/ UACS.   Discussed the use of AI scribe software for clinical note transcription with the patient, who gave verbal consent to proceed.  History of Present Illness   The patient, previously diagnosed with an upper airway cough, has been under treatment with pantoprazole , Pepcid , a prednisone  taper, Tessalon  Perles, and Symbicort  80mcg 1-2 puffs morning and evening. Despite these interventions, the patient reports persistent symptoms, particularly mucus production and coughing. The patient describes the cough as originating more from the throat than the bronchial areas, unlike the initial presentation. She expresses a need for a medication to reduce mucus and a stronger cough suppressant, as she does not perceive significant relief from Tessalon  Perles or Delsym cough syrup.   The patient has also been trialing acid reflux medication, but does not believe it is effective, occasionally feeling slightly nauseated post-administration. She has been adhering to dietary restrictions to manage potential acid reflux, but does not believe this to be the primary issue. The patient has also tried Mucinex, but did not notice a significant improvement. She has a history of flu twice a year and a half ago, which was when the cough initially started.  The patient has been using Symbicort  (two puffs twice daily) and finds prednisone  to be the most effective in managing symptoms, although she dislikes its side effects. She reports no coughing when lying down at night and sometimes feels that walking around reduces coughing. The patient has a history of scarring in the lungs, as revealed by a CT scan, but no nodules were found.     02/02/2024- interim hx  Discussed the use of AI scribe software for clinical  note transcription with the patient, who gave verbal consent to proceed.  History of Present Illness   Margaret Hunter is a 78 year old female who presents with a persistent cough.   She has experienced a persistent cough since 2024 following a viral illness. Initially, the cough was deep, leading to coughing spells that were difficult to stop. Over time, the cough has evolved to a sensation of loose mucus that needs to be expelled, without the occurrence of coughing fits. The cough is now more of an upper airway issue.  She has tried various treatments including prednisone , Singulair, omeprazole, and Symbicort , with some improvement noted with prednisone  and Singulair. Allergy testing indicated sensitivities to dust and mold. She has also used ipratropium nasal spray and prescription cough syrup, which she finds effective when used preventatively before attending events. She has not been using acid reflux medications or nasal sprays for  the past two months, noting no significant change in her symptoms.  Her current medications include promethazine  DM, which she uses as needed, particularly before attending events to prevent coughing. She has not started Mucinex due to concerns about its effects. She does not take regular allergy medications as she does not experience typical allergy symptoms like sneezing or runny eyes.  Her cough worsens with exposure to allergens, such as when trimming bushes, but she does not take allergy medications regularly. She has a history of being on alendronate, which was discontinued due to coughing as a side effect, but she has since been placed on another medication by her primary physician.  She has been off Symbicort , pantoprazole , and Pepcid  without worsening of her cough. She avoids known reflux triggers such as red wine and spicy foods, and her only chocolate intake is minimal, with no significant reflux symptoms reported.  She has a dog and spends time outdoors,  which may contribute to her exposure to allergens. No sneezing, runny eyes, and congestion, which are typical allergy symptoms.       Allergies  Allergen Reactions   Penicillin G     Has patient had a PCN reaction causing immediate rash, facial/tongue/throat swelling, SOB or lightheadedness with hypotension: unkn Has patient had a PCN reaction causing severe rash involving mucus membranes or skin necrosis:  unkn Has patient had a PCN reaction that required hospitalization: unkn  Has patient had a PCN reaction occurring within the last 10 years: no  If all of the above answers are "NO", then may proceed with Cephalosporin use.     Immunization History  Administered Date(s) Administered   Unspecified SARS-COV-2 Vaccination 07/01/2023   Zoster Recombinant(Shingrix) 10/10/2017    Past Medical History:  Diagnosis Date   Cancer (HCC) 01/2018   right breast cancer   Cholelithiasis    Family history of ovarian cancer    Hypercholesterolemia    Hypertension     Tobacco History: Social History   Tobacco Use  Smoking Status Never  Smokeless Tobacco Never   Counseling given: Not Answered   Outpatient Medications Prior to Visit  Medication Sig Dispense Refill   amLODipine  (NORVASC ) 2.5 MG tablet Take 1 tablet (2.5 mg total) by mouth daily.     atorvastatin  (LIPITOR) 20 MG tablet Take 1 tablet (20 mg total) by mouth daily.     benzonatate  (TESSALON ) 200 MG capsule Take 1 capsule (200 mg total) by mouth every 4 (four) hours as needed for cough (up to every 4 hours prn). 30 capsule 1   Biotin  1000 MCG CHEW Chew 1 tablet by mouth in the morning and at bedtime. 30 tablet    budesonide -formoterol  (SYMBICORT ) 80-4.5 MCG/ACT inhaler Take 2 puffs first thing in am and then another 2 puffs about 12 hours later. (Patient not taking: Reported on 12/10/2023) 1 each 12   cholecalciferol (VITAMIN D) 1000 units tablet Take 2,000 Units by mouth daily.     famotidine  (PEPCID ) 20 MG tablet One after  supper 30 tablet 11   ibandronate (BONIVA) 150 MG tablet Take 150 mg by mouth every 30 (thirty) days. Take in the morning with a full glass of water, on an empty stomach, and do not take anything else by mouth or lie down for the next 30 min.     ipratropium (ATROVENT ) 0.03 % nasal spray Place 2 sprays into both nostrils every 12 (twelve) hours. (Patient not taking: Reported on 12/10/2023) 30 mL 12   Multiple Vitamin (MULTIVITAMIN) tablet  Take 1 tablet by mouth daily.     olmesartan-hydrochlorothiazide (BENICAR HCT) 40-25 MG tablet      pantoprazole  (PROTONIX ) 40 MG tablet TAKE 1 TABLET(40 MG) BY MOUTH DAILY 30 TO 60 MINUTES BEFORE FIRST MEAL OF THE DAY 30 tablet 2   promethazine -dextromethorphan (PROMETHAZINE -DM) 6.25-15 MG/5ML syrup Take 5 mLs by mouth 4 (four) times daily as needed for cough. 240 mL 0   No facility-administered medications prior to visit.   Review of Systems  Review of Systems  Constitutional: Negative.   HENT: Negative.    Respiratory:  Positive for cough. Negative for chest tightness, shortness of breath and wheezing.   Cardiovascular: Negative.    Physical Exam  There were no vitals taken for this visit. Physical Exam Constitutional:      Appearance: Normal appearance. She is not ill-appearing.  HENT:     Head: Normocephalic and atraumatic.  Cardiovascular:     Rate and Rhythm: Normal rate and regular rhythm.  Pulmonary:     Effort: Pulmonary effort is normal.     Breath sounds: Normal breath sounds. No wheezing, rhonchi or rales.  Neurological:     General: No focal deficit present.     Mental Status: She is alert and oriented to person, place, and time. Mental status is at baseline.  Psychiatric:        Mood and Affect: Mood normal.        Behavior: Behavior normal.        Thought Content: Thought content normal.        Judgment: Judgment normal.      Lab Results:  CBC    Component Value Date/Time   WBC 8.2 02/04/2018 0829   RBC 4.65 02/04/2018  0829   HGB 13.2 02/04/2018 0829   HCT 40.4 02/04/2018 0829   PLT 252 02/04/2018 0829   MCV 86.8 02/04/2018 0829   MCH 28.4 02/04/2018 0829   MCHC 32.8 02/04/2018 0829   RDW 14.7 (H) 02/04/2018 0829   LYMPHSABS 1.6 02/04/2018 0829   MONOABS 0.7 02/04/2018 0829   EOSABS 0.1 02/04/2018 0829   BASOSABS 0.0 02/04/2018 0829    BMET    Component Value Date/Time   NA 140 05/01/2018 1400   K 3.7 05/01/2018 1400   CL 104 05/01/2018 1400   CO2 27 05/01/2018 1400   GLUCOSE 118 (H) 05/01/2018 1400   BUN 20 05/01/2018 1400   CREATININE 0.66 05/01/2018 1400   CREATININE 0.77 02/04/2018 0829   CALCIUM  10.4 (H) 05/01/2018 1400   GFRNONAA >60 05/01/2018 1400   GFRNONAA >60 02/04/2018 0829   GFRAA >60 05/01/2018 1400   GFRAA >60 02/04/2018 0829    BNP No results found for: "BNP"  ProBNP No results found for: "PROBNP"  Imaging: No results found.   Assessment & Plan:   1. Upper airway cough syndrome - famotidine  (PEPCID ) 20 MG tablet; One after supper  Dispense: 30 tablet; Refill: 11  Assessment and Plan    Chronic cough Chronic cough following a viral illness in 2024, initially treated with prednisone  and Singulair with partial improvement. Current symptoms include a loose mucus sensation without coughing fits. Promethazine  DM provides symptomatic relief when used preventatively. No significant improvement with nasal spray or acid reflux medications. No wheezing on examination. Symbicort  and pantoprazole  have been discontinued without worsening of symptoms. - Continue promethazine  DM every 6 hours as needed for cough. - Start Mucinex extra strength 1200 mg twice daily with a glass of water for 1-2 weeks, then  as needed. - Discontinue Symbicort    Allergic rhinitis due to dust and mold Symptoms include increased coughing when exposed to allergens.  - Start Allegra once daily. - Continue ipratropium nasal spray.  Gastroesophageal reflux disease (GERD) GERD managed with lifestyle  modifications including avoidance of red wine, chocolate, and spicy foods. No significant reflux symptoms reported. - Continue Pepcid  (famotidine ) at night. - Discontinue pantoprazole     Antonio Baumgarten, NP 02/02/2024

## 2024-02-28 DIAGNOSIS — I10 Essential (primary) hypertension: Secondary | ICD-10-CM | POA: Diagnosis not present

## 2024-02-28 DIAGNOSIS — E785 Hyperlipidemia, unspecified: Secondary | ICD-10-CM | POA: Diagnosis not present

## 2024-02-28 DIAGNOSIS — E6609 Other obesity due to excess calories: Secondary | ICD-10-CM | POA: Diagnosis not present

## 2024-02-28 DIAGNOSIS — C50311 Malignant neoplasm of lower-inner quadrant of right female breast: Secondary | ICD-10-CM | POA: Diagnosis not present

## 2024-03-08 DIAGNOSIS — M170 Bilateral primary osteoarthritis of knee: Secondary | ICD-10-CM | POA: Diagnosis not present

## 2024-03-17 DIAGNOSIS — L570 Actinic keratosis: Secondary | ICD-10-CM | POA: Diagnosis not present

## 2024-03-17 DIAGNOSIS — L218 Other seborrheic dermatitis: Secondary | ICD-10-CM | POA: Diagnosis not present

## 2024-03-17 DIAGNOSIS — D1801 Hemangioma of skin and subcutaneous tissue: Secondary | ICD-10-CM | POA: Diagnosis not present

## 2024-03-17 DIAGNOSIS — L304 Erythema intertrigo: Secondary | ICD-10-CM | POA: Diagnosis not present

## 2024-03-17 DIAGNOSIS — L821 Other seborrheic keratosis: Secondary | ICD-10-CM | POA: Diagnosis not present

## 2024-03-17 DIAGNOSIS — Z85828 Personal history of other malignant neoplasm of skin: Secondary | ICD-10-CM | POA: Diagnosis not present

## 2024-03-17 DIAGNOSIS — D225 Melanocytic nevi of trunk: Secondary | ICD-10-CM | POA: Diagnosis not present

## 2024-03-17 DIAGNOSIS — B351 Tinea unguium: Secondary | ICD-10-CM | POA: Diagnosis not present

## 2024-03-22 ENCOUNTER — Other Ambulatory Visit: Payer: Self-pay | Admitting: Primary Care

## 2024-03-22 MED ORDER — PROMETHAZINE-DM 6.25-15 MG/5ML PO SYRP: 5.0000 mL | ORAL_SOLUTION | Freq: Four times a day (QID) | ORAL | 0 refills | Status: DC | PRN

## 2024-03-22 NOTE — Telephone Encounter (Signed)
 Copied from CRM [id number]. Topic: Clinical - Medication Refill >> Mar 22, 2024 10:34 AM Devaughn RAMAN wrote: Medication: promethazine -dextromethorphan (PROMETHAZINE -DM) 6.25-15 MG/5ML syrup  Has the patient contacted their pharmacy? Yes (Agent: If no, request that the patient contact the pharmacy for the refill. If patient does not wish to contact the pharmacy document the reason why and proceed with request.) (Agent: If yes, when and what did the pharmacy advise?)  This is the patient's preferred pharmacy:  [id number] [place], [place] - [address] AT [facility] [address] [facility] [address] [facility] [address] [place] [address] Phone: [phone] Fax: [phone]  Is this the correct pharmacy for this prescription? Yes If no, delete pharmacy and type the correct one.   Has the prescription been filled recently? Yes  Is the patient out of the medication? Yes  Has the patient been seen for an appointment in the last year OR does the patient have an upcoming appointment? Yes  Can we respond through MyChart? Yes  Agent: Please be advised that Rx refills may take up to 3 business days. We ask that you follow-up with your pharmacy.

## 2024-04-16 DIAGNOSIS — Z1231 Encounter for screening mammogram for malignant neoplasm of breast: Secondary | ICD-10-CM | POA: Diagnosis not present

## 2024-04-19 ENCOUNTER — Other Ambulatory Visit: Payer: Self-pay | Admitting: Internal Medicine

## 2024-04-19 DIAGNOSIS — R058 Other specified cough: Secondary | ICD-10-CM

## 2024-05-01 NOTE — Progress Notes (Unsigned)
 Margaret Hunter, female    DOB: [birth year]   MRN: [id number]   Brief patient profile:  1  yowf never smoker never problem until 70s coughs tended to  linger p uri but did not require rx referred to pulmonary clinic 06/30/2023 by Dr Teresa  for bad flu-like illness  Nov 2023 with cough ever exac with another ? viral URI Jan 2024 and even worse since   Rx march 2024 with albuterol/ nasal spray ? Maybe some better p prednisone  and singulair but persisted. Omeprazole some better Allergist/Whelan > dust/ mold rx trelegy / nasal spray and antihistamine some better Whelan added dpi > Worse   Note every intervention ranked as some better x dpi which made it worse      History of Present Illness  06/30/2023  Pulmonary/ 1st office eval/Harutyun Monteverde  Chief Complaint  Patient presents with   Consult    Cough ( 9 months and dry ) pt has been on prednisone  and antibiotics. pt had a ct which showed nodules  but was not on the lungs. Has tried reflex meds, has tried albuterol inhaler. Pt seen a allergist   Dyspnea:  Not limited by breathing from desired activities  / limited by L knee but but prior to this no problem Cough: severe coughing fits mostly dry with sensation of globus / using lots of halls seems to helps but only while has one in mouth  Worse with voice use  Sleep: flat bed bid pillow on L side maybe once a week  SABA use: none  Rec Upper airway cough syndrome = Richard Honore  Stop fosamax , trelegy, nasal spray, levocertizine  Pantoprazole  (protonix ) 40 mg   Take  30-60 min before first meal of the day and Pepcid  (famotidine )  20 mg after supper until return to office GERD diet reviewed, bed blocks rec   Prednisone  10 mg take  4 each am x 2 days,   2 each am x 2 days,  1 each am x 2 days and stop  For cough or urge to cough  > add tessalon  pearl  200 every 4-6 hours as needed (add to the candy)  Please schedule a follow up office visit in 4-6  weeks, sooner if needed  with all medications  /inhalers/ solutions in hand   07/29/2023  f/u ov/Marcell Pfeifer re: cough x Nov 2023 c/w cough variant asthma vs  UACS maint on reflux rx   only p initial resolution of cough on pred  Chief Complaint  Patient presents with   Follow-up    Patient states her cough has not gone away. Pt has done a round of prednisone  which helped, but the cough came back.    Dyspnea:  Not limited by breathing from desired activities   Cough: worse late in pm before hs  Sleeping: flat bed one pillow on side s  resp cc or am flare  SABA use: none  02: none  Rec Pantoprazole  (protonix ) 40 mg   Take  30-60 min before first meal of the day and Pepcid  (famotidine )  20 mg after supper until return to office  Prednisone  10 mg take  4 each am x 2 days,   2 each am x 2 days,  1 each am x 2 days and stop  For cough or urge to cough  > add tessalon  pearl  200 every 4-6 hours as needed (add to the candy)  Symbicort  80 1-2 puffs every 12 hours  Please schedule a  follow up office visit in 6 weeks, call sooner if needed with all medications /inhalers/ solutions in hand    05/04/2024  f/u ov/Sueko Dimichele re: cough x Nov 2023   maint on pepcid   20 mg  after supper  did  bring all meds  Chief Complaint  Patient presents with   upper airway cough    Cough is better than it was a year ago in September.  Mucous is less than it was a year ago.  She does have times where she still has coughing spells.  Dyspnea:  none walking dog some hills ok s sob or cough  Cough:  no am flares min mucoid/ and worse p supper / sense of lump in throat/ worse with exp to certain detergents / min mucoid production Sleeping: bed is flat 1 pillow no cough at all  SABA use: none      No obvious other patterns in  day to day or daytime variability or assoc excess/ purulent sputum or mucus plugs or hemoptysis or cp or chest tightness, subjective wheeze or overt sinus or hb symptoms.    Also denies any obvious fluctuation of symptoms with weather or environmental  changes or other aggravating or alleviating factors except as outlined above   No unusual exposure hx or h/o childhood pna/ asthma or knowledge of premature birth.  Current Allergies, Complete Past Medical History, Past Surgical History, Family History, and Social History were reviewed in [name] record.  ROS  The following are not active complaints unless bolded Hoarseness, sore throat= globus but intermittent , dysphagia, dental problems, itching, sneezing,  nasal congestion or discharge of excess mucus or purulent secretions, ear ache,   fever, chills, sweats, unintended wt loss or wt gain, classically pleuritic or exertional cp,  orthopnea pnd or arm/hand swelling  or leg swelling, presyncope, palpitations, abdominal pain, anorexia, nausea, vomiting, diarrhea  or change in bowel habits or change in bladder habits, change in stools or change in urine, dysuria, hematuria,  rash, arthralgias, visual complaints, headache, numbness, weakness or ataxia or problems with walking or coordination,  change in mood or  memory.        Current Meds  Medication Sig   amLODipine  (NORVASC ) 2.5 MG tablet Take 1 tablet (2.5 mg total) by mouth daily.   atorvastatin  (LIPITOR) 20 MG tablet Take 1 tablet (20 mg total) by mouth daily.   cholecalciferol (VITAMIN D) 1000 units tablet Take 2,000 Units by mouth daily.   cyanocobalamin (VITAMIN B12) 100 MCG tablet Take 100 mcg by mouth daily.   famotidine  (PEPCID ) 20 MG tablet TAKE 1 TABLET BY MOUTH AFTER SUPPER   fexofenadine  (ALLEGRA  ALLERGY) 180 MG tablet Take 1 tablet (180 mg total) by mouth daily.   ibandronate (BONIVA) 150 MG tablet Take 150 mg by mouth every 30 (thirty) days. Take in the morning with a full glass of water, on an empty stomach, and do not take anything else by mouth or lie down for the next 30 min.   ipratropium (ATROVENT ) 0.03 % nasal spray Place 2 sprays into both nostrils 2 (two) times daily.   Multiple Vitamin  (MULTIVITAMIN) tablet Take 1 tablet by mouth daily.   olmesartan-hydrochlorothiazide (BENICAR HCT) 40-25 MG tablet    OVER THE COUNTER MEDICATION Take 400 mg by mouth 2 (two) times daily as needed (mucous). Mucous relief   promethazine -dextromethorphan (PROMETHAZINE -DM) 6.25-15 MG/5ML syrup Take 5 mLs by mouth 4 (four) times daily as needed for cough.  Past Medical History:  Diagnosis Date   Cancer (HCC) 01/2018   right breast cancer   Cholelithiasis    Family history of ovarian cancer    Hypercholesterolemia    Hypertension       Objective:    Wts  05/04/2024          190  07/29/2023      192   06/30/23 191 lb 3.2 oz (86.7 kg)  12/10/22 189 lb 8 oz (86 kg)  10/03/22 184 lb (83.5 kg)    Vital signs reviewed  05/04/2024  - Note at rest 02 sats  96% on RA   General appearance:    amb mod obese (by bmi) wf nad/ slt raspy voice   HEENT : Oropharynx  clear      Nasal turbinates nl    NECK :  without  apparent JVD/ palpable Nodes/TM    LUNGS: no acc muscle use,  Nl contour chest which is clear to A and P bilaterally with slt urge to cough at end exp    CV:  RRR  no s3 or murmur or increase in P2, and no edema   ABD:  soft and nontender   MS:  Gait nl   ext warm without deformities Or obvious joint restrictions  calf tenderness, cyanosis or clubbing    SKIN: warm and dry without lesions    NEURO:  alert, approp, nl sensorium with  no motor or cerebellar deficits apparent.     CXR PA and Lateral:   05/04/2024 :    I personally reviewed images and impression is as follows:       Mild non-specific increase in lung markings         Assessment

## 2024-05-04 ENCOUNTER — Ambulatory Visit (INDEPENDENT_AMBULATORY_CARE_PROVIDER_SITE_OTHER): Admitting: Internal Medicine

## 2024-05-04 ENCOUNTER — Encounter: Payer: Self-pay | Admitting: Internal Medicine

## 2024-05-04 ENCOUNTER — Ambulatory Visit (INDEPENDENT_AMBULATORY_CARE_PROVIDER_SITE_OTHER)

## 2024-05-04 VITALS — BP 120/75 | HR 92 | Temp 97.9°F | Ht 62.0 in | Wt 190.8 lb

## 2024-05-04 DIAGNOSIS — R059 Cough, unspecified: Secondary | ICD-10-CM | POA: Diagnosis not present

## 2024-05-04 DIAGNOSIS — R058 Other specified cough: Secondary | ICD-10-CM | POA: Diagnosis not present

## 2024-05-04 MED ORDER — PANTOPRAZOLE SODIUM 40 MG PO TBEC: 40.0000 mg | DELAYED_RELEASE_TABLET | Freq: Every day | ORAL | 2 refills | Status: DC

## 2024-05-04 MED ORDER — METHYLPREDNISOLONE ACETATE 80 MG/ML IJ SUSP
120.0000 mg | Freq: Once | INTRAMUSCULAR | Status: AC
  Administered 2024-05-04: 120 mg via INTRAMUSCULAR

## 2024-05-04 NOTE — Patient Instructions (Addendum)
 Change to Mucinex DM 1200 mg every 12 hours  as needed   Restart Pantoprazole  (protonix ) 40 mg   Take  30-60 min before first meal of the day and continue Pepcid  (famotidine )  20 mg after supper until return to office - this is the best way to tell whether stomach acid is contributing to your problem.    If you can your biggest meal should be lunch   Please remember to go to the  x-ray department  for your tests - we will call you with the results when they are available    Depomedrol 120 mg IM   Please schedule a follow up office visit in 4 weeks (ok to add on) , sooner if needed  with all medications /inhalers/ solutions in hand (including the old ones)  so we can verify exactly what you are taking. This includes all medications from all doctors and over the counters

## 2024-05-04 NOTE — Assessment & Plan Note (Signed)
 Onset since Nov 2023  - cyclical cough rx 06/30/2023 >>> prednisone  helped while on it plus another week  so 07/29/2023 repeat same but this time start symbicort  80 1-2 bid > not convinced it worked so stopped it - 05/04/2024 restart max gerd rx plus mucinex dm and depomedrol 120 mg IM and f/u in 4 weeks with all meds, new and old  Discussed with pt at length The standardized cough guidelines published in Chest by Charlie Coder in 2006 are still the best available and consist of a multiple step process (up to 12!) , not a single office visit,  and are intended  to address this problem logically,  with an alogrithm dependent on response to empiric treatment at  each progressive step  to determine a specific diagnosis with  minimal addtional testing needed. Therefore if adherence is an issue or can't be accurately verified,  it's very unlikely the standard evaluation and treatment will be successful here.    Furthermore, response to therapy (other than acute cough suppression, which should only be used short term with avoidance of narcotic containing cough syrups if possible), can be a gradual process for which the patient is not likely to  perceive immediate benefit.  Unlike going to an eye doctor where the best perscription is almost always the first one and is immediately effective, this is almost never the case in the management of chronic cough syndromes. Therefore the patient needs to commit up front to consistently adhere to recommendations  for up to 4-6 weeks of therapy directed at the likely underlying problem(s) before the response can be reasonably evaluated.   Rec return in 4 weeks with all meds in hand using a trust but verify approach to confirm accurate Medication  Reconciliation The principal here is that until we are certain that the  patients are doing what we've asked, it makes no sense to ask them to do more.          Each maintenance medication was reviewed in detail including  emphasizing most importantly the difference between maintenance and prns and under what circumstances the prns are to be triggered using an action plan format where appropriate.  Total time for H and P, chart review, counseling,  and generating customized AVS unique to this office visit / same day charting = 42 min for chronic truly   refractory respiratory  symptoms of uncertain etiology

## 2024-05-12 ENCOUNTER — Ambulatory Visit: Payer: Self-pay | Admitting: Internal Medicine

## 2024-05-13 NOTE — Progress Notes (Signed)
 Called and spoke to patient. Relayed xray results per Dr. Darlean. Pt verbalized understanding, NFN.

## 2024-06-02 NOTE — Progress Notes (Unsigned)
 Margaret Hunter, female    DOB: 1946-03-07   MRN: [id number]   Brief patient profile:  51  yowf never smoker never problem until 70s coughs tended to  linger p uri but did not require rx referred to pulmonary clinic 06/30/2023 by Dr Teresa  for bad flu-like illness  Nov 2023 with cough ever since exac with another ? viral URI Jan 2024 and even worse since   Rx march 2024 with albuterol/ nasal spray ? Maybe some better p prednisone  and singulair but persisted. Omeprazole some better Allergist/Margaret Hunter > dust/ mold rx trelegy / nasal spray and antihistamine some better Margaret Hunter added dpi > Worse   Note every intervention ranked as some better x dpi which made it worse  History of Present Illness  06/30/2023  Pulmonary/ 1st office eval/Margaret Hunter  Chief Complaint  Patient presents with   Consult    Cough ( 9 months and dry ) pt has been on prednisone  and antibiotics. pt had a ct which showed nodules  but was not on the lungs. Has tried reflex meds, has tried albuterol inhaler. Pt seen a allergist   Dyspnea:  Not limited by breathing from desired activities  / limited by L knee but but prior to this no problem Cough: severe coughing fits mostly dry with sensation of globus / using lots of halls seems to helps but only while has one in mouth  Worse with voice use  Sleep: flat bed bid pillow on L side maybe once a week  SABA use: none  Rec Upper airway cough syndrome = Margaret Hunter  Stop fosamax , trelegy, nasal spray, levocertizine  Pantoprazole  (protonix ) 40 mg   Take  30-60 min before first meal of the day and Pepcid  (famotidine )  20 mg after supper until return to office GERD diet reviewed, bed blocks rec   Prednisone  10 mg take  4 each am x 2 days,   2 each am x 2 days,  1 each am x 2 days and stop  For cough or urge to cough  > add tessalon  pearl  200 every 4-6 hours as needed (add to the candy)  Please schedule a follow up office visit in 4-6  weeks, sooner if needed  with all medications  /inhalers/ solutions in hand   07/29/2023  f/u ov/Margaret Hunter re: cough x Nov 2023 c/w cough variant asthma vs  UACS maint on reflux rx   only p initial resolution of cough on pred  Chief Complaint  Patient presents with   Follow-up    Patient states her cough has not gone away. Pt has done a round of prednisone  which helped, but the cough came back.    Dyspnea:  Not limited by breathing from desired activities   Cough: worse late in pm before hs  Sleeping: flat bed one pillow on side s  resp cc or am flare  SABA use: none  02: none  Rec Pantoprazole  (protonix ) 40 mg   Take  30-60 min before first meal of the day and Pepcid  (famotidine )  20 mg after supper until return to office  Prednisone  10 mg take  4 each am x 2 days,   2 each am x 2 days,  1 each am x 2 days and stop  For cough or urge to cough  > add tessalon  pearl  200 every 4-6 hours as needed (add to the candy)  Symbicort  80 1-2 puffs every 12 hours  Please schedule a follow up office  visit in 6 weeks, call sooner if needed with all medications /inhalers/ solutions in hand    05/04/2024  f/u ov/Margaret Hunter re: cough x Nov 2023   maint on pepcid   20 mg  after supper  did  bring all meds  Chief Complaint  Patient presents with   upper airway cough    Cough is better than it was a year ago in September.  Mucous is less than it was a year ago.  She does have times where she still has coughing spells.  Dyspnea:  none walking dog some hills ok s sob or cough  Cough:  no am flares min mucoid/ and worse p supper / sense of lump in throat/ worse with exp to certain detergents / min mucoid production Sleeping: bed is flat 1 pillow no cough at all  SABA use: none  Rec Change to Mucinex DM 1200 mg every 12 hours  as needed  Restart Pantoprazole  (protonix ) 40 mg   Take  30-60 min before first meal of the day and continue Pepcid  (famotidine )  20 mg after supper until return to office - this is the best way to tell whether stomach acid is contributing to  your problem.   If you can your biggest meal should be lunch  Depomedrol 120 mg IM  Cxr ok  Please schedule a follow up office visit in 4 weeks (ok to add on) , sooner if needed  with all medications /inhalers/ solutions in hand   06/03/2024  f/u ov/[place] office/Margaret Hunter re: cough x Nov 2023  maint on gerd rx and no symbicort  80 did  bring all meds - in retrospect pred still the best rx  Chief Complaint  Patient presents with   Cough    Dry cough   Dyspnea:  walking dog including hills s limiting doe  Cough: late after afternoon and before supper  Sleeping: bed is flat / one pillow and in general not waking up noct or am with  cough for the first hour hour then starts up  dry harsh / hacking quality  but minimal mucoid production  SABA use: none  02: none    No obvious other pattern in  day to day or daytime variability or assoc excess/ purulent sputum or mucus plugs or hemoptysis or cp or chest tightness, subjective wheeze or overt sinus or hb symptoms.    Also denies any obvious fluctuation of symptoms with weather or environmental changes or other aggravating or alleviating factors except as outlined above   No unusual exposure hx or h/o childhood pna/ asthma or knowledge of premature birth.  Current Allergies, Complete Past Medical History, Past Surgical History, Family History, and Social History were reviewed in [name] record.  ROS  The following are not active complaints unless bolded Hoarseness, sore throat, dysphagia, dental problems, itching, sneezing,  nasal congestion or discharge of excess mucus or purulent secretions, ear ache,   fever, chills, sweats, unintended wt loss or wt gain, classically pleuritic or exertional cp,  orthopnea pnd or arm/hand swelling  or leg swelling, presyncope, palpitations, abdominal pain, anorexia, nausea, vomiting, diarrhea  or change in bowel habits or change in bladder habits, change in stools or change in urine,  dysuria, hematuria,  rash, arthralgias, visual complaints, headache, numbness, weakness or ataxia or problems with walking or coordination,  change in mood or  memory.        Current Meds  Medication Sig   amLODipine  (NORVASC ) 2.5 MG tablet Take  1 tablet (2.5 mg total) by mouth daily.   atorvastatin  (LIPITOR) 20 MG tablet Take 1 tablet (20 mg total) by mouth daily.   Biotin  1000 MCG CHEW Chew 1 tablet by mouth in the morning and at bedtime.   cholecalciferol (VITAMIN D) 1000 units tablet Take 2,000 Units by mouth daily.   cyanocobalamin (VITAMIN B12) 100 MCG tablet Take 100 mcg by mouth daily.   famotidine  (PEPCID ) 20 MG tablet TAKE 1 TABLET BY MOUTH AFTER SUPPER   fexofenadine  (ALLEGRA  ALLERGY) 180 MG tablet Take 1 tablet (180 mg total) by mouth daily.   ibandronate (BONIVA) 150 MG tablet Take 150 mg by mouth every 30 (thirty) days. Take in the morning with a full glass of water, on an empty stomach, and do not take anything else by mouth or lie down for the next 30 min.   ipratropium (ATROVENT ) 0.03 % nasal spray Place 2 sprays into both nostrils 2 (two) times daily.   Multiple Vitamin (MULTIVITAMIN) tablet Take 1 tablet by mouth daily.   olmesartan-hydrochlorothiazide (BENICAR HCT) 40-25 MG tablet    OVER THE COUNTER MEDICATION Take 400 mg by mouth 2 (two) times daily as needed (mucous). Mucous relief   pantoprazole  (PROTONIX ) 40 MG tablet Take 1 tablet (40 mg total) by mouth daily. Take 30-60 min before first meal of the day   promethazine -dextromethorphan (PROMETHAZINE -DM) 6.25-15 MG/5ML syrup Take 5 mLs by mouth 4 (four) times daily as needed for cough.            Past Medical History:  Diagnosis Date   Cancer (HCC) 01/2018   right breast cancer   Cholelithiasis    Family history of ovarian cancer    Hypercholesterolemia    Hypertension       Objective:    Wts  06/03/2024          185   05/04/2024          190  07/29/2023      192   06/30/23 191 lb 3.2 oz (86.7 kg)   12/10/22 189 lb 8 oz (86 kg)  10/03/22 184 lb (83.5 kg)      Vital signs reviewed  06/03/2024  - Note at rest 02 sats  95% on RA   General appearance:    amb pleasant wf     HEENT : Oropharynx  clear      Nasal turbinates nl    NECK :  without  apparent JVD/ palpable Nodes/TM    LUNGS: no acc muscle use,  Nl contour chest which is clear to A and P bilaterally with urge to cough more on insp than exp   CV:  RRR  no s3 or murmur or increase in P2, and no edema   ABD:  soft and nontender   MS:  Gait nl   ext warm without deformities Or obvious joint restrictions  calf tenderness, cyanosis or clubbing    SKIN: warm and dry without lesions    NEURO:  alert, approp, nl sensorium with  no motor or cerebellar deficits apparent.        Assessment     Assessment & Plan Upper airway cough syndrome Onset since Nov 2023  - cyclical cough rx 06/30/2023 >>> prednisone  helped while on it plus another week  so 07/29/2023 repeat same but this time start symbicort  80 1-2 bid > not convinced it worked so stopped it - 05/04/2024 restart max gerd rx plus mucinex dm and depomedrol 120 mg IM and f/u  in 4 weeks with all meds, new and old - 06/03/2024  After extensive coaching inhaler device,  effectiveness =    75% but ? Symbicort  80 made her cough so rec pred x 6 days and symb 80 one bid x one week then 2 bid if tol and suppress cough with promethazine  DM  Comment:  the response to pred still suggests a component of cough variant asthma so we'll challenge her once more with OCS and ICS and if not better next step is trial of gabapentin    Discussed in detail all the  indications, usual  risks and alternatives  relative to the benefits with patient who agrees to proceed with Rx as outlined.             Each maintenance medication was reviewed in detail including emphasizing most importantly the difference between maintenance and prns and under what circumstances the prns are to be triggered using an  action plan format where appropriate.  Total time for H and P, chart review, counseling, reviewing hfa  device(s) and generating customized AVS unique to this office visit / same day charting = 35 min for  refractory respiratory  symptoms of uncertain etiology          AVS  Patient Instructions  Prednisone  10 mg take  4 each am (at breakfast)  x 2 days,   2 each am x 2 days,  1 each am x 2 days and stop   Symbicort  80 (budesonide -formoterol ) 1 puff twice daily and the  increase to Take 2 puffs first thing in am and then another 2 puffs about 12 hours later.   Please schedule a follow up visit in 3 months but call sooner if needed      Ozell America, MD 06/03/2024

## 2024-06-03 ENCOUNTER — Ambulatory Visit (INDEPENDENT_AMBULATORY_CARE_PROVIDER_SITE_OTHER): Admitting: Internal Medicine

## 2024-06-03 ENCOUNTER — Encounter: Payer: Self-pay | Admitting: Internal Medicine

## 2024-06-03 VITALS — BP 130/56 | HR 60 | Temp 97.8°F | Ht 62.0 in | Wt 185.6 lb

## 2024-06-03 DIAGNOSIS — R058 Other specified cough: Secondary | ICD-10-CM | POA: Diagnosis not present

## 2024-06-03 MED ORDER — PREDNISONE 10 MG PO TABS
ORAL_TABLET | ORAL | 0 refills | Status: DC
Start: 2024-06-03 — End: 2024-08-23

## 2024-06-03 MED ORDER — PROMETHAZINE-DM 6.25-15 MG/5ML PO SYRP: 5.0000 mL | ORAL_SOLUTION | Freq: Four times a day (QID) | ORAL | 2 refills | Status: DC | PRN

## 2024-06-03 MED ORDER — BUDESONIDE-FORMOTEROL FUMARATE 80-4.5 MCG/ACT IN AERO
INHALATION_SPRAY | RESPIRATORY_TRACT | 11 refills | Status: DC
Start: 2024-06-03 — End: 2024-08-18

## 2024-06-03 NOTE — Patient Instructions (Signed)
 Prednisone  10 mg take  4 each am (at breakfast)  x 2 days,   2 each am x 2 days,  1 each am x 2 days and stop   Symbicort  80 (budesonide -formoterol ) 1 puff twice daily and the  increase to Take 2 puffs first thing in am and then another 2 puffs about 12 hours later.   Please schedule a follow up visit in 3 months but call sooner if needed

## 2024-06-03 NOTE — Assessment & Plan Note (Addendum)
 Onset since Nov 2023  - cyclical cough rx 06/30/2023 >>> prednisone  helped while on it plus another week  so 07/29/2023 repeat same but this time start symbicort  80 1-2 bid > not convinced it worked so stopped it - 05/04/2024 restart max gerd rx plus mucinex dm and depomedrol 120 mg IM and f/u in 4 weeks with all meds, new and old - 06/03/2024  After extensive coaching inhaler device,  effectiveness =    75% but ? Symbicort  80 made her cough so rec pred x 6 days and symb 80 one bid x one week then 2 bid if tol and suppress cough with promethazine  DM  Comment:  the response to pred still suggests a component of cough variant asthma so we'll challenge her once more with OCS and ICS and if not better next step is trial of gabapentin    Discussed in detail all the  indications, usual  risks and alternatives  relative to the benefits with patient who agrees to proceed with Rx as outlined.             Each maintenance medication was reviewed in detail including emphasizing most importantly the difference between maintenance and prns and under what circumstances the prns are to be triggered using an action plan format where appropriate.  Total time for H and P, chart review, counseling, reviewing hfa  device(s) and generating customized AVS unique to this office visit / same day charting = 35 min for  refractory respiratory  symptoms of uncertain etiology

## 2024-06-07 DIAGNOSIS — H04123 Dry eye syndrome of bilateral lacrimal glands: Secondary | ICD-10-CM | POA: Diagnosis not present

## 2024-06-07 DIAGNOSIS — H2513 Age-related nuclear cataract, bilateral: Secondary | ICD-10-CM | POA: Diagnosis not present

## 2024-06-07 DIAGNOSIS — H524 Presbyopia: Secondary | ICD-10-CM | POA: Diagnosis not present

## 2024-06-09 DIAGNOSIS — M170 Bilateral primary osteoarthritis of knee: Secondary | ICD-10-CM | POA: Diagnosis not present

## 2024-06-25 DIAGNOSIS — I10 Essential (primary) hypertension: Secondary | ICD-10-CM | POA: Diagnosis not present

## 2024-06-25 DIAGNOSIS — E669 Obesity, unspecified: Secondary | ICD-10-CM | POA: Diagnosis not present

## 2024-06-25 DIAGNOSIS — E559 Vitamin D deficiency, unspecified: Secondary | ICD-10-CM | POA: Diagnosis not present

## 2024-06-25 DIAGNOSIS — Z23 Encounter for immunization: Secondary | ICD-10-CM | POA: Diagnosis not present

## 2024-06-25 DIAGNOSIS — Z853 Personal history of malignant neoplasm of breast: Secondary | ICD-10-CM | POA: Diagnosis not present

## 2024-06-25 DIAGNOSIS — G43909 Migraine, unspecified, not intractable, without status migrainosus: Secondary | ICD-10-CM | POA: Diagnosis not present

## 2024-06-25 DIAGNOSIS — R7303 Prediabetes: Secondary | ICD-10-CM | POA: Diagnosis not present

## 2024-06-25 DIAGNOSIS — M8588 Other specified disorders of bone density and structure, other site: Secondary | ICD-10-CM | POA: Diagnosis not present

## 2024-06-25 DIAGNOSIS — Z1331 Encounter for screening for depression: Secondary | ICD-10-CM | POA: Diagnosis not present

## 2024-06-25 DIAGNOSIS — Z0000 Encounter for general adult medical examination without abnormal findings: Secondary | ICD-10-CM | POA: Diagnosis not present

## 2024-06-25 DIAGNOSIS — E785 Hyperlipidemia, unspecified: Secondary | ICD-10-CM | POA: Diagnosis not present

## 2024-08-09 DIAGNOSIS — Z23 Encounter for immunization: Secondary | ICD-10-CM | POA: Diagnosis not present

## 2024-08-10 ENCOUNTER — Other Ambulatory Visit: Payer: Self-pay | Admitting: Internal Medicine

## 2024-08-10 DIAGNOSIS — R058 Other specified cough: Secondary | ICD-10-CM

## 2024-08-18 ENCOUNTER — Other Ambulatory Visit: Payer: Self-pay | Admitting: Internal Medicine

## 2024-08-23 ENCOUNTER — Encounter: Payer: Self-pay | Admitting: Internal Medicine

## 2024-08-23 ENCOUNTER — Ambulatory Visit (INDEPENDENT_AMBULATORY_CARE_PROVIDER_SITE_OTHER): Admitting: Internal Medicine

## 2024-08-23 VITALS — BP 130/72 | HR 53 | Ht 62.0 in | Wt 183.0 lb

## 2024-08-23 DIAGNOSIS — R058 Other specified cough: Secondary | ICD-10-CM

## 2024-08-23 MED ORDER — TRAMADOL HCL 50 MG PO TABS: 50.0000 mg | ORAL_TABLET | ORAL | 0 refills | Status: DC | PRN

## 2024-08-23 MED ORDER — FAMOTIDINE 20 MG PO TABS: ORAL_TABLET | ORAL | 11 refills | Status: DC

## 2024-08-23 MED ORDER — PROMETHAZINE-DM 6.25-15 MG/5ML PO SYRP: 5.0000 mL | ORAL_SOLUTION | Freq: Four times a day (QID) | ORAL | 2 refills | Status: DC | PRN

## 2024-08-23 MED ORDER — PROMETHAZINE-DM 6.25-15 MG/5ML PO SYRP: 5.0000 mL | ORAL_SOLUTION | Freq: Four times a day (QID) | ORAL | 2 refills | Status: AC | PRN

## 2024-08-23 MED ORDER — OMEPRAZOLE 40 MG PO CPDR: DELAYED_RELEASE_CAPSULE | ORAL | 2 refills | Status: DC

## 2024-08-23 NOTE — Patient Instructions (Signed)
 Symbicort  is 1st thing in am x 2 puffs and if start to feel the urge the cough go ahead and use the cough syrup up to 4 x daily   Stop mucinex dm for now   Please schedule a follow up office visit in 8 weeks, sooner if needed

## 2024-08-23 NOTE — Progress Notes (Unsigned)
 Margaret Hunter, female    DOB: [birth year]   MRN: [id number]   Brief patient profile:  6  yowf never smoker never problem until her 70s coughs tended to  linger p uri but did not require rx referred to pulmonary clinic 06/30/2023 by Dr Teresa  for bad flu-like illness  Nov 2023 with cough ever since exac with another ? viral URI Jan 2024 and even worse since   Rx march 2024 with albuterol/ nasal spray ? Maybe some better p prednisone  and singulair but persisted. Omeprazole  some better Allergist/Whelan > dust/ mold rx trelegy / nasal spray and antihistamine some better Whelan added dpi > Worse   Note every intervention ranked as some better x dpi which made it worse  History of Present Illness  06/30/2023  Pulmonary/ 1st Hunter eval/Margaret Hunter  Chief Complaint  Patient presents with   Consult    Cough ( 9 months and dry ) pt has been on prednisone  and antibiotics. pt had a ct which showed nodules  but was not on the lungs. Has tried reflex meds, has tried albuterol inhaler. Pt seen a allergist   Dyspnea:  Not limited by breathing from desired activities  / limited by L knee but but prior to this no problem Cough: severe coughing fits mostly dry with sensation of globus / using lots of halls seems to helps but only while has one in mouth  Worse with voice use  Sleep: flat bed bid pillow on L side maybe once a week  SABA use: none  Rec Upper airway cough syndrome = Richard Honore  Stop fosamax , trelegy, nasal spray, levocertizine  Pantoprazole  (protonix ) 40 mg   Take  30-60 min before first meal of the day and Pepcid  (famotidine )  20 mg after supper until return to Hunter GERD diet reviewed, bed blocks rec   Prednisone  10 mg take  4 each am x 2 days,   2 each am x 2 days,  1 each am x 2 days and stop  For cough or urge to cough  > add tessalon  pearl  200 every 4-6 hours as needed (add to the candy)  Please schedule a follow up Hunter visit in 4-6  weeks, sooner if needed  with all medications  /inhalers/ solutions in hand   07/29/2023  f/u ov/Margaret Hunter re: cough x Nov 2023 c/w cough variant asthma vs  UACS maint on reflux rx   only p initial resolution of cough on pred  Chief Complaint  Patient presents with   Follow-up    Patient states her cough has not gone away. Pt has done a round of prednisone  which helped, but the cough came back.    Dyspnea:  Not limited by breathing from desired activities   Cough: worse late in pm before hs  Sleeping: flat bed one pillow on side s  resp cc or am flare  SABA use: none  02: none  Rec Pantoprazole  (protonix ) 40 mg   Take  30-60 min before first meal of the day and Pepcid  (famotidine )  20 mg after supper until return to Hunter  Prednisone  10 mg take  4 each am x 2 days,   2 each am x 2 days,  1 each am x 2 days and stop  For cough or urge to cough  > add tessalon  pearl  200 every 4-6 hours as needed (add to the candy)  Symbicort  80 1-2 puffs every 12 hours  Please schedule a follow up  Hunter visit in 6 weeks, call sooner if needed with all medications /inhalers/ solutions in hand    06/03/2024  f/u ov/Margaret Hunter/Margaret Hunter re: cough x Nov 2023  maint on gerd rx and no symbicort  80 did  bring all meds - in retrospect pred still the best rx  Chief Complaint  Patient presents with   Cough    Dry cough   Dyspnea:  walking dog including hills s limiting doe  Cough: late after afternoon and before supper  Sleeping: bed is flat / one pillow and in general not waking up noct or am with  cough for the first hour hour then starts up  dry harsh / hacking quality  but minimal mucoid production  SABA use: none  02: none Patient Instructions  Prednisone  10 mg take  4 each am (at breakfast)  x 2 days,   2 each am x 2 days,  1 each am x 2 days and stop  Symbicort  80 (budesonide -formoterol ) 1 puff twice daily and the  increase to Take 2 puffs first thing in am and then another 2 puffs about 12 hours later.      08/23/2024  3 m f/u ov/Margaret Hunter re: cough x  Nov 2023    maint on gerd rx only  Chief Complaint  Patient presents with   Medical Management of Chronic Issues   Cough  Dyspnea:  walking dog s doe about as much as she ever does in terms of activity and not limited even on hills  Cough: not much production, not worse in am but starts up about an hour after stirs around   Sleeping: bed is flat / 2 pillows, L side down helps, R side worse x years  02: none   No obvious day to day or daytime variability or assoc excess/ purulent sputum or mucus plugs or hemoptysis or cp or chest tightness, subjective wheeze or overt sinus or hb symptoms.    Also denies any obvious fluctuation of symptoms with weather or environmental changes or other aggravating or alleviating factors except as outlined above   No unusual exposure hx or h/o childhood pna/ asthma or knowledge of premature birth.  Current Allergies, Complete Past Medical History, Past Surgical History, Family History, and Social History were reviewed in [name] record.  ROS  The following are not active complaints unless bolded Hoarseness, sore throat, dysphagia, dental problems, itching, sneezing,  nasal congestion or discharge of excess mucus or purulent secretions, ear ache,   fever, chills, sweats, unintended wt loss or wt gain, classically pleuritic or exertional cp,  orthopnea pnd or arm/hand swelling  or leg swelling, presyncope, palpitations, abdominal pain, anorexia, nausea, vomiting, diarrhea  or change in bowel habits or change in bladder habits, change in stools or change in urine, dysuria, hematuria,  rash, arthralgias, visual complaints, headache, numbness, weakness or ataxia or problems with walking or coordination,  change in mood or  memory.        Current Meds  Medication Sig   amLODipine  (NORVASC ) 2.5 MG tablet Take 1 tablet (2.5 mg total) by mouth daily.   atorvastatin  (LIPITOR) 20 MG tablet Take 1 tablet (20 mg total) by mouth daily.    budesonide -formoterol  (SYMBICORT ) 80-4.5 MCG/ACT inhaler TAKE 2 PUFFS FIRST THING IN AM AND THEN ANOTHER 2 PUFFS ABOUT 12 HOURS LATER   cholecalciferol (VITAMIN D) 1000 units tablet Take 2,000 Units by mouth daily.   dextromethorphan-guaiFENesin (MUCINEX DM) 30-600 MG 12hr tablet Take 1 tablet by  mouth 2 (two) times daily.   famotidine  (PEPCID ) 20 MG tablet TAKE 1 TABLET BY MOUTH AFTER SUPPER   Multiple Vitamin (MULTIVITAMIN) tablet Take 1 tablet by mouth daily.   olmesartan-hydrochlorothiazide (BENICAR HCT) 40-25 MG tablet    pantoprazole  (PROTONIX ) 40 MG tablet TAKE 1 TABLET(40 MG) BY MOUTH DAILY 30 TO 60 MINUTES BEFORE FIRST MEAL OF THE DAY   promethazine -dextromethorphan (PROMETHAZINE -DM) 6.25-15 MG/5ML syrup Take 5 mLs by mouth 4 (four) times daily as needed for cough.      Past Medical History:  Diagnosis Date   Cancer (HCC) 01/2018   right breast cancer   Cholelithiasis    Family history of ovarian cancer    Hypercholesterolemia    Hypertension       Objective:    Wts  08/23/2024      183  06/03/2024          185   05/04/2024          190  07/29/2023      192   06/30/23 191 lb 3.2 oz (86.7 kg)  12/10/22 189 lb 8 oz (86 kg)  10/03/22 184 lb (83.5 kg)      Vital signs reviewed  08/23/2024  - Note at rest 02 sats  98% on RA   General appearance:    amb wf nad/ occ throat clearing    HEENT : Oropharynx  pristine      Nasal turbinates nl    NECK :  without  apparent JVD/ palpable Nodes/TM    LUNGS: no acc muscle use,  Nl contour chest which is clear to A and P bilaterally without cough on insp or exp maneuvers   CV:  RRR  no s3 or murmur or increase in P2, and no edema   ABD:  soft and nontender   MS:  Gait nl   ext warm without deformities Or obvious joint restrictions  calf tenderness, cyanosis or clubbing    SKIN: warm and dry without lesions    NEURO:  alert, approp, nl sensorium with  no motor or cerebellar deficits apparent.        Assessment       Assessment & Plan Upper airway cough syndrome Onset since Nov 2023  ?  Component of cough variant asthma  - cyclical cough rx 06/30/2023 >>> prednisone  helped while on it plus another week  so 07/29/2023 repeat same but this time start symbicort  80 1-2 bid > not convinced it worked so stopped it - 05/04/2024 restart max gerd rx plus mucinex dm and depomedrol 120 mg IM and f/u in 4 weeks with all meds, new and old - 06/03/2024  After extensive coaching inhaler device,  effectiveness =    75% but ? Symbicort  80 made her cough so rec pred x 6 days and symb 80 one bid x one week then 2 bid if tol and suppress cough with promethazine  DM  >>>   08/23/2024  After extensive coaching inhaler device,  effectiveness =    80% hfa > continue symbicort  80 but take 1st thing in am and heavy cough suppression with hard rock candy and promethazine  dm   Discussed in detail all the  indications, usual  risks and alternatives  relative to the benefits with patient who agrees to proceed with Rx as outlined.            Each maintenance medication was reviewed in detail including emphasizing most importantly the difference between maintenance and prns and under  what circumstances the prns are to be triggered using an action plan format where appropriate.  Total time for H and P, chart review, counseling, reviewing hfa  device(s) and generating customized AVS unique to this Hunter visit / same day charting = 25 min  for  refractory respiratory  cough  of uncertain etiology             AVS  Patient Instructions  Symbicort  is 1st thing in am x 2 puffs and if start to feel the urge the cough go ahead and use the cough syrup up to 4 x daily   Stop mucinex dm for now   Please schedule a follow up Hunter visit in 8 weeks, sooner if needed       Ozell America, MD 08/24/2024

## 2024-08-24 NOTE — Assessment & Plan Note (Addendum)
 Onset since Nov 2023  - cyclical cough rx 06/30/2023 >>> prednisone  helped while on it plus another week  so 07/29/2023 repeat same but this time start symbicort  80 1-2 bid > not convinced it worked so stopped it - 05/04/2024 restart max gerd rx plus mucinex dm and depomedrol 120 mg IM and f/u in 4 weeks with all meds, new and old - 06/03/2024  After extensive coaching inhaler device,  effectiveness =    75% but ? Symbicort  80 made her cough so rec pred x 6 days and symb 80 one bid x one week then 2 bid if tol and suppress cough with promethazine  DM - 08/23/2024  After extensive coaching inhaler device,  effectiveness =    80% hfa > continue symbicort  but take 1st thing in am and heavy cough suppression with hard rock candy and promethazine  dm   Comment:

## 2024-08-31 DIAGNOSIS — R262 Difficulty in walking, not elsewhere classified: Secondary | ICD-10-CM | POA: Diagnosis not present

## 2024-09-08 ENCOUNTER — Encounter: Payer: Self-pay | Admitting: Pulmonary Disease

## 2024-09-08 ENCOUNTER — Ambulatory Visit (INDEPENDENT_AMBULATORY_CARE_PROVIDER_SITE_OTHER): Admitting: Pulmonary Disease

## 2024-09-08 DIAGNOSIS — M25562 Pain in left knee: Secondary | ICD-10-CM | POA: Diagnosis not present

## 2024-09-08 DIAGNOSIS — K219 Gastro-esophageal reflux disease without esophagitis: Secondary | ICD-10-CM

## 2024-09-08 DIAGNOSIS — R053 Chronic cough: Secondary | ICD-10-CM | POA: Diagnosis not present

## 2024-09-08 DIAGNOSIS — M25561 Pain in right knee: Secondary | ICD-10-CM | POA: Diagnosis not present

## 2024-09-08 MED ORDER — PREDNISONE 20 MG PO TABS: ORAL_TABLET | ORAL | 0 refills | Status: AC

## 2024-09-08 NOTE — Progress Notes (Signed)
 @Patient  ID: Margaret Hunter, female    DOB: 11/29/1945, 78 y.o.   MRN: [id number]  Chief Complaint  Patient presents with   Cough    Cough x2y. Sx improved, however she caught a cold recently and cough worsened. Cough is prod with clear sputum.     Referring provider: Teresa Channel, MD  HPI:   78 y.o. woman with history of chronic cough, multifactorial, here with worsening cough triggered by viral infection.  Multiple prior pulmonary notes reviewed.  Been coughing more.  Doubt virus.  Sound like viral.  She describes a cold.  Since then cough has been worse.  More frequent.  She has had a cough now for over a year working through it.  Had been controlled with Symbicort , PPI and H2 blocker.  In addition to promethazine /dextromethorphan cough suppressant.  Unfortunately seems to flare again.  Historically prednisone  reliably improved symptoms.  We connected via video.  Had audio issues so switched to telephone call for audio.  Questionaires / Pulmonary Flowsheets:   ACT:      No data to display          MMRC:     No data to display          Epworth:      No data to display          Tests:   FENO:  No results found for: NITRICOXIDE  PFT:     No data to display          WALK:      No data to display          Imaging: No results found.  Lab Results: Personally reviewed CBC    Component Value Date/Time   WBC 8.2 02/04/2018 0829   RBC 4.65 02/04/2018 0829   HGB 13.2 02/04/2018 0829   HCT 40.4 02/04/2018 0829   PLT 252 02/04/2018 0829   MCV 86.8 02/04/2018 0829   MCH 28.4 02/04/2018 0829   MCHC 32.8 02/04/2018 0829   RDW 14.7 (H) 02/04/2018 0829   LYMPHSABS 1.6 02/04/2018 0829   MONOABS 0.7 02/04/2018 0829   EOSABS 0.1 02/04/2018 0829   BASOSABS 0.0 02/04/2018 0829    BMET    Component Value Date/Time   NA 140 05/01/2018 1400   K 3.7 05/01/2018 1400   CL 104 05/01/2018 1400   CO2 27 05/01/2018 1400   GLUCOSE 118 (H)  05/01/2018 1400   BUN 20 05/01/2018 1400   CREATININE 0.66 05/01/2018 1400   CREATININE 0.77 02/04/2018 0829   CALCIUM  10.4 (H) 05/01/2018 1400   GFRNONAA >60 05/01/2018 1400   GFRNONAA >60 02/04/2018 0829   GFRAA >60 05/01/2018 1400   GFRAA >60 02/04/2018 0829    BNP No results found for: BNP  ProBNP No results found for: PROBNP  Specialty Problems       Pulmonary Problems   Upper airway cough syndrome   Onset since Nov 2023  - cyclical cough rx 06/30/2023 >>> prednisone  helped while on it plus another week  so 07/29/2023 repeat same but this time start symbicort  80 1-2 bid > not convinced it worked so stopped it - 05/04/2024 restart max gerd rx plus mucinex dm and depomedrol 120 mg IM and f/u in 4 weeks with all meds, new and old - 06/03/2024  After extensive coaching inhaler device,  effectiveness =    75% but ? Symbicort  80 made her cough so rec pred x 6 days and symb 80 one  bid x one week then 2 bid if tol and suppress cough with promethazine  DM       Allergies  Allergen Reactions   Penicillin G     Has patient had a PCN reaction causing immediate rash, facial/tongue/throat swelling, SOB or lightheadedness with hypotension: unkn Has patient had a PCN reaction causing severe rash involving mucus membranes or skin necrosis:  unkn Has patient had a PCN reaction that required hospitalization: unkn  Has patient had a PCN reaction occurring within the last 10 years: no  If all of the above answers are NO, then may proceed with Cephalosporin use.     Immunization History  Administered Date(s) Administered   INFLUENZA, HIGH DOSE SEASONAL PF 07/30/2011, 07/01/2012, 06/03/2013, 06/21/2015, 06/21/2016, 06/06/2017, 07/02/2018, 06/25/2024   PNEUMOCOCCAL CONJUGATE-20 06/25/2024   Unspecified SARS-COV-2 Vaccination 07/01/2023   Zoster Recombinant(Shingrix) 10/10/2017    Past Medical History:  Diagnosis Date   Cancer (HCC) 01/2018   right breast cancer   Cholelithiasis     Family history of ovarian cancer    Hypercholesterolemia    Hypertension     Tobacco History: Social History   Tobacco Use  Smoking Status Never  Smokeless Tobacco Never   Counseling given: Not Answered   Continue to not smoke  Outpatient Encounter Medications as of 09/08/2024  Medication Sig   amLODipine  (NORVASC ) 2.5 MG tablet Take 1 tablet (2.5 mg total) by mouth daily.   atorvastatin  (LIPITOR) 20 MG tablet Take 1 tablet (20 mg total) by mouth daily.   budesonide -formoterol  (SYMBICORT ) 80-4.5 MCG/ACT inhaler TAKE 2 PUFFS FIRST THING IN AM AND THEN ANOTHER 2 PUFFS ABOUT 12 HOURS LATER   dextromethorphan-guaiFENesin (MUCINEX DM) 30-600 MG 12hr tablet Take 1 tablet by mouth 2 (two) times daily.   famotidine  (PEPCID ) 20 MG tablet TAKE 1 TABLET BY MOUTH AFTER SUPPER   Multiple Vitamin (MULTIVITAMIN) tablet Take 1 tablet by mouth daily.   olmesartan-hydrochlorothiazide (BENICAR HCT) 40-25 MG tablet    pantoprazole  (PROTONIX ) 40 MG tablet TAKE 1 TABLET(40 MG) BY MOUTH DAILY 30 TO 60 MINUTES BEFORE FIRST MEAL OF THE DAY   predniSONE  (DELTASONE ) 20 MG tablet Take 2 tablets (40 mg total) by mouth daily with breakfast for 5 days, THEN 1 tablet (20 mg total) daily with breakfast for 5 days.   promethazine -dextromethorphan (PROMETHAZINE -DM) 6.25-15 MG/5ML syrup Take 5 mLs by mouth 4 (four) times daily as needed for cough.   cholecalciferol (VITAMIN D) 1000 units tablet Take 2,000 Units by mouth daily. (Patient not taking: Reported on 09/08/2024)   No facility-administered encounter medications on file as of 09/08/2024.     Review of Systems  Review of Systems  Comprehensive review of systems negative unless mentioned in HPI above. Physical Exam  There were no vitals taken for this visit.  Wt Readings from Last 5 Encounters:  08/23/24 183 lb (83 kg)  06/03/24 185 lb 9.6 oz (84.2 kg)  05/04/24 190 lb 12.8 oz (86.5 kg)  02/02/24 189 lb 9.6 oz (86 kg)  12/10/23 189 lb 4.8 oz (85.9  kg)    BMI Readings from Last 5 Encounters:  08/23/24 33.47 kg/m  06/03/24 33.95 kg/m  05/04/24 34.90 kg/m  02/02/24 34.68 kg/m  12/10/23 34.62 kg/m     Physical Exam General: Sitting up, appears well Pulmonary: Speaking in full sentences appears to not be short of breath when speaking HEENT: Atraumatic, normocephalic  Assessment & Plan:   Acute on chronic cough: Triggered by what sounds like cold, viral illness.  Presumed  triggering underlying asthma.  Reliably improved with prednisone  in the past.  Prednisone  taper.  Encouraged to continue inhalers.  Encouraged to continue other medicines to treat cough.  GERD: Good adherence to PPI.  Encouraged to continue.   No follow-ups on file.   Donnice JONELLE Beals, MD 09/08/2024  Virtual Visit via Video Note  I connected with Margaret Hunter on 09/08/24 at  1:30 PM EST by a video enabled telemedicine application and verified that I am speaking with the correct person using two identifiers.  Location: Patient: Home Provider: Office - [facility] - [address] [place], [address], [place], [place] [address]  I discussed the limitations of evaluation and management by telemedicine and the availability of in person appointments. The patient expressed understanding and agreed to proceed. I also discussed with the patient that there may be a patient responsible charge related to this service. The patient expressed understanding and agreed to proceed.  Patient consented to consult via telephone: Yes People present and their role in pt care: Pt

## 2024-09-10 ENCOUNTER — Telehealth: Payer: Self-pay

## 2024-09-10 NOTE — Telephone Encounter (Signed)
 Copied from CRM #[id number]. Topic: Clinical - Medical Advice >> Sep 06, 2024 12:00 PM Rilla B wrote: Reason for CRM: Patient states she is dealing with a cough that has gotten worse with a cold.  Wants to know if Dr Darlean would order her a round prednisone .  She states she has used it in the past and it really cleans things up.  Please call patient @ [phone].  Dr Annella, you saw this pt 2 days ago. Can you please advise

## 2024-09-10 NOTE — Telephone Encounter (Signed)
 Copied from CRM [id number]. Topic: Clinical - Medical Advice >> Sep 07, 2024  2:02 PM Devaughn RAMAN wrote: Reason for CRM: Pt calling regarding prednisone , pt called on yesterday and stated she is still awaiting a response. Pt stated she has a cold and needs something for it. >> Sep 08, 2024 12:04 PM Abigail D wrote: Pt called back regarding the request for prednisone , advised that she may need an acute visit for medication to be prescribed. She will be seeing Dr. Annella for a virtual appt at 1:30. >> Sep 08, 2024  9:06 AM Isabell A wrote: Patient complaints no one has called her back for three days and she is just needing prednisone .   Callback number: [phone] or [phone]   Pt was prescribed prednisone  at [facility] 12/10.

## 2024-11-09 ENCOUNTER — Ambulatory Visit: Admitting: Internal Medicine
